# Patient Record
Sex: Female | Born: 1990 | Hispanic: Yes | Marital: Single | Smoking: Never smoker
Health system: Southern US, Community
[De-identification: ages and names within clinical notes are randomized; demographics above are authoritative.]

## PROBLEM LIST (undated history)

## (undated) DIAGNOSIS — Z789 Other specified health status: Secondary | ICD-10-CM

## (undated) HISTORY — DX: Other specified health status: Z78.9

## (undated) HISTORY — PX: NO PAST SURGERIES: SHX2092

---

## 2012-04-25 ENCOUNTER — Emergency Department (HOSPITAL_COMMUNITY)
Admission: EM | Admit: 2012-04-25 | Discharge: 2012-04-25 | Discharge status: Against Medical Advice | Payer: Medicaid Other | Attending: Emergency Medicine | Admitting: Emergency Medicine

## 2012-04-25 ENCOUNTER — Encounter (HOSPITAL_COMMUNITY): Payer: Self-pay | Admitting: *Deleted

## 2012-04-25 DIAGNOSIS — IMO0002 Reserved for concepts with insufficient information to code with codable children: Secondary | ICD-10-CM | POA: Insufficient documentation

## 2012-04-25 DIAGNOSIS — S0083XA Contusion of other part of head, initial encounter: Secondary | ICD-10-CM

## 2012-04-25 DIAGNOSIS — Z3201 Encounter for pregnancy test, result positive: Secondary | ICD-10-CM | POA: Insufficient documentation

## 2012-04-25 DIAGNOSIS — Z3490 Encounter for supervision of normal pregnancy, unspecified, unspecified trimester: Secondary | ICD-10-CM

## 2012-04-25 LAB — URINALYSIS, ROUTINE W REFLEX MICROSCOPIC
Bilirubin Urine: NEGATIVE
Nitrite: NEGATIVE
Specific Gravity, Urine: 1.019 (ref 1.005–1.030)
Urobilinogen, UA: 0.2 mg/dL (ref 0.0–1.0)
pH: 6 (ref 5.0–8.0)

## 2012-04-25 LAB — URINE MICROSCOPIC-ADD ON

## 2012-04-25 LAB — PREGNANCY, URINE: Preg Test, Ur: POSITIVE — AB

## 2012-04-25 MED ORDER — TETANUS-DIPHTH-ACELL PERTUSSIS 5-2.5-18.5 LF-MCG/0.5 IM SUSP
0.5000 mL | Freq: Once | INTRAMUSCULAR | Status: DC
  Filled 2012-04-25: qty 0.5
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
## 2012-04-25 NOTE — ED Notes (Signed)
Pt not located
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
## 2012-08-20 LAB — OB RESULTS CONSOLE ABO/RH

## 2012-08-20 LAB — OB RESULTS CONSOLE HIV ANTIBODY (ROUTINE TESTING): HIV: NONREACTIVE

## 2012-08-20 LAB — OB RESULTS CONSOLE ANTIBODY SCREEN: Antibody Screen: NEGATIVE

## 2012-10-23 NOTE — L&D Delivery Note (Signed)
Delivery Note At 11:43 PM a viable female was delivered via Vaginal, Spontaneous Delivery (Presentation: Vertex ;  ).  APGAR: 9 - 10, ; weight . 3285 grams   Placenta status: intact , .  Cord: 3 vessel  with the following complications: None .  Cord pH: none  Anesthesia:  None Episiotomy:  None Lacerations: None Suture Repair: None Est. Blood Loss (mL): 350  Mom to postpartum.  Baby to nursery-stable.  Ruthetta Koopmann A 11/23/2012, 12:19 AM

## 2012-10-31 LAB — OB RESULTS CONSOLE GC/CHLAMYDIA: Gonorrhea: NEGATIVE

## 2012-10-31 LAB — OB RESULTS CONSOLE GBS: GBS: NEGATIVE

## 2012-11-21 ENCOUNTER — Encounter (HOSPITAL_COMMUNITY): Payer: Self-pay | Admitting: Obstetrics and Gynecology

## 2012-11-21 ENCOUNTER — Inpatient Hospital Stay (HOSPITAL_COMMUNITY): Payer: Medicaid Other

## 2012-11-21 ENCOUNTER — Inpatient Hospital Stay (HOSPITAL_COMMUNITY)
Admission: AD | Admit: 2012-11-21 | Discharge: 2012-11-21 | Disposition: A | Discharge status: Home / Self Care | Payer: Medicaid Other | Source: Ambulatory Visit | Attending: Obstetrics | Admitting: Obstetrics

## 2012-11-21 DIAGNOSIS — Z3689 Encounter for other specified antenatal screening: Secondary | ICD-10-CM

## 2012-11-21 DIAGNOSIS — O479 False labor, unspecified: Secondary | ICD-10-CM | POA: Insufficient documentation

## 2012-11-21 DIAGNOSIS — O368190 Decreased fetal movements, unspecified trimester, not applicable or unspecified: Secondary | ICD-10-CM | POA: Insufficient documentation

## 2012-11-21 HISTORY — DX: Other specified health status: Z78.9

## 2012-11-21 NOTE — MAU Note (Signed)
"  Decreased fetal movement since yesterday.  I was sent here today from the doctor's office.  The CNM sent me over here, because the baby wasn't moving that much in the office."

## 2012-11-21 NOTE — MAU Provider Note (Signed)
Chief Complaint:  No chief complaint on file.   First Provider Initiated Contact with Patient 11/21/12 1752      HPI: Haley Velasquez is a 22 y.o. [id number] at [redacted]w[redacted]d who presents to maternity admissions reporting decreased fetal movement. The patient states that the last time she felt normal movement was Tuesday. She was seen in the office by Dr. Gaynell Face today and told that she was 3 cm dilated. She denies vaginal bleeding, LOF, abnormal discharge, abdominal pain or contractions.   Past Medical History: Past Medical History  Diagnosis Date  . No pertinent past medical history     Past obstetric history: OB History    Grav Para Term Preterm Abortions TAB SAB Ect Mult Living   6 5 5       5      # Outc Date GA Lbr Len/2nd Wgt Sex Del Anes PTL Lv   1 TRM            2 TRM            3 TRM            4 TRM            5 TRM            6 CUR               Past Surgical History: Past Surgical History  Procedure Date  . No past surgeries     Family History: History reviewed. No pertinent family history.  Social History: History  Substance Use Topics  . Smoking status: Never Smoker   . Smokeless tobacco: Not on file  . Alcohol Use: No    Allergies: No Known Allergies  Meds:  No prescriptions prior to admission    ROS: Pertinent findings in history of present illness.  Physical Exam  Blood pressure 120/53, pulse 61, temperature 97.3 F (36.3 C), temperature source Oral, resp. rate 18, SpO2 100.00%. GENERAL: Well-developed, well-nourished female in no acute distress.  HEENT: normocephalic HEART: normal rate and rhythm RESP: normal effort, clear to auscultation ABDOMEN: Soft, non-tender, gravid appropriate for gestational age EXTREMITIES: Nontender, no edema NEURO: alert and oriented  Dilation: 3 Effacement (%): 60 Cervical Position: Posterior Station: -3 Exam by:: Naaman Plummer PA  First 30 minutes of monitoring: FHT:  Baseline 120, minimal variability, one  acceleration present, no decelerations Contractions: q 8 mins  Next 20 minutes FHT: Baseline 125, moderate variability, accelerations present, no decelerations Contractions: q 8 minutes   Imaging:  *RADIOLOGY REPORT*   Clinical Data: 22 year old pregnant female with decreased fetal  movement. Assigned gestational age of [redacted] weeks 4 days.   LIMITED OBSTETRIC ULTRASOUND  BIOPHYSICAL PROFILE   Number of Fetuses: 1  Heart Rate: 126bpm  Movement: Present  Presentation: Cephalic   MATERNAL FINDINGS:  Cervix: Not evaluated  Uterus/Adnexae: Not evaluated  BPP:  Movement: 2 Time: 5 minutes  Breathing: 2  Tone: 2  Amniotic Fluid: 2  Total Score: 8/8   Impression:  Single living intrauterine gestation in cephalic presentation -  assigned gestational age of [redacted] weeks 4 days.  BPP - 8/8.   This exam is performed on an emergent basis and does not  comprehensively evaluate fetal size, dating, or anatomy.   Original Report Authenticated By: Harmon Pier, M.D.  MAU Course: Discussed patient with Dr. Clearance Coots. He recommends BPP today because of non-reactive NST NST reactive after BPP. BPP 8/8. Cervix unchanged from Dr. Gaynell Face exam earlier today. Patient  does not feel her contractions.   Assessment: 1. NST (non-stress test) reactive     Plan: Discharge home Labor precautions and fetal kick counts discussed Patient encouraged to increase PO intake and maintain adequate hydration Patient will call Dr. Elsie Stain office tomorrow to find out about follow-up. Patient states that she does not have a follow-up appointment scheduled at this time.  Patient may return to MAU as needed      Follow-up Information    Follow up with MARSHALL,BERNARD A, MD. (as scheduled)    Contact information:   [address] [place] [address] [phone]           Medication List     As of 11/21/2012  7:59 PM    TAKE these medications         acetaminophen-codeine 300-30  MG per tablet   Commonly known as: TYLENOL #3   Take 1 tablet by mouth every 4 (four) hours as needed. headache      guaiFENesin 100 MG/5ML Soln   Commonly known as: ROBITUSSIN   Take 5 mLs by mouth every 4 (four) hours as needed.         Freddi Starr, PA-C 11/21/2012 7:59 PM

## 2012-11-22 ENCOUNTER — Inpatient Hospital Stay (HOSPITAL_COMMUNITY)
Admission: AD | Admit: 2012-11-22 | Discharge: 2012-11-24 | DRG: 775 | Disposition: A | Discharge status: Home / Self Care | Payer: Medicaid Other | Source: Ambulatory Visit | Attending: Obstetrics | Admitting: Obstetrics

## 2012-11-22 ENCOUNTER — Encounter (HOSPITAL_COMMUNITY): Payer: Self-pay | Admitting: *Deleted

## 2012-11-22 DIAGNOSIS — O480 Post-term pregnancy: Principal | ICD-10-CM | POA: Diagnosis present

## 2012-11-22 DIAGNOSIS — O368190 Decreased fetal movements, unspecified trimester, not applicable or unspecified: Secondary | ICD-10-CM | POA: Diagnosis present

## 2012-11-22 LAB — CBC
Hemoglobin: 10.9 g/dL — ABNORMAL LOW (ref 12.0–15.0)
MCHC: 32.8 g/dL (ref 30.0–36.0)
Platelets: 399 10*3/uL (ref 150–400)
RDW: 14.8 % (ref 11.5–15.5)

## 2012-11-22 MED ORDER — ACETAMINOPHEN 325 MG PO TABS: 650.0000 mg | ORAL_TABLET | ORAL | Status: DC | PRN

## 2012-11-22 MED ORDER — OXYTOCIN BOLUS FROM INFUSION
500.0000 mL | INTRAVENOUS | Status: DC
  Administered 2012-11-22: 500 mL via INTRAVENOUS

## 2012-11-22 MED ORDER — IBUPROFEN 600 MG PO TABS
600.0000 mg | ORAL_TABLET | Freq: Four times a day (QID) | ORAL | Status: DC | PRN
  Administered 2012-11-23: 600 mg via ORAL
  Filled 2012-11-22: qty 1

## 2012-11-22 MED ORDER — ONDANSETRON HCL 4 MG/2ML IJ SOLN: 4.0000 mg | Freq: Four times a day (QID) | INTRAMUSCULAR | Status: DC | PRN

## 2012-11-22 MED ORDER — PROMETHAZINE HCL 25 MG/ML IJ SOLN
25.0000 mg | Freq: Four times a day (QID) | INTRAMUSCULAR | Status: DC | PRN
  Administered 2012-11-22: 25 mg via INTRAMUSCULAR
  Filled 2012-11-22: qty 1

## 2012-11-22 MED ORDER — LACTATED RINGERS IV SOLN: 500.0000 mL | INTRAVENOUS | Status: DC | PRN

## 2012-11-22 MED ORDER — OXYCODONE-ACETAMINOPHEN 5-325 MG PO TABS: 1.0000 | ORAL_TABLET | ORAL | Status: DC | PRN

## 2012-11-22 MED ORDER — LACTATED RINGERS IV SOLN
INTRAVENOUS | Status: DC
  Administered 2012-11-22: 19:00:00 via INTRAVENOUS

## 2012-11-22 MED ORDER — CITRIC ACID-SODIUM CITRATE 334-500 MG/5ML PO SOLN: 30.0000 mL | ORAL | Status: DC | PRN

## 2012-11-22 MED ORDER — NALBUPHINE SYRINGE 5 MG/0.5 ML
10.0000 mg | INJECTION | INTRAMUSCULAR | Status: DC | PRN
  Administered 2012-11-22 (×2): 10 mg via INTRAVENOUS
  Filled 2012-11-22 (×3): qty 1

## 2012-11-22 MED ORDER — LIDOCAINE HCL (PF) 1 % IJ SOLN
30.0000 mL | INTRAMUSCULAR | Status: DC | PRN
Start: 2012-11-22 — End: 2012-11-23
  Filled 2012-11-22 (×2): qty 30

## 2012-11-22 MED ORDER — OXYTOCIN 40 UNITS IN LACTATED RINGERS INFUSION - SIMPLE MED
62.5000 mL/h | INTRAVENOUS | Status: DC
  Filled 2012-11-22: qty 1000

## 2012-11-22 MED ORDER — NALBUPHINE SYRINGE 5 MG/0.5 ML
10.0000 mg | INJECTION | Freq: Four times a day (QID) | INTRAMUSCULAR | Status: DC | PRN
  Administered 2012-11-22: 10 mg via INTRAMUSCULAR
  Filled 2012-11-22 (×2): qty 1

## 2012-11-22 MED ORDER — TERBUTALINE SULFATE 1 MG/ML IJ SOLN: 0.2500 mg | Freq: Once | INTRAMUSCULAR | Status: AC | PRN

## 2012-11-22 MED ORDER — OXYTOCIN 40 UNITS IN LACTATED RINGERS INFUSION - SIMPLE MED
1.0000 m[IU]/min | INTRAVENOUS | Status: DC
  Administered 2012-11-22: 1 m[IU]/min via INTRAVENOUS

## 2012-11-22 NOTE — Progress Notes (Signed)
Haley Velasquez is a 22 y.o. [id number] at [redacted]w[redacted]d by LMP admitted for decreased FM and UC's with favorable cervix, postdates.  Subjective:   Objective: BP 101/54  Pulse 62  Temp 97.8 F (36.6 C) (Oral)  Resp 18  Ht 4\' 10"  (1.473 m)  Wt 152 lb (68.947 kg)  BMI 31.77 kg/m2  Breastfeeding? Unknown      FHT:  FHR: 150 bpm, variability: moderate,  accelerations:  Present,  decelerations:  Absent UC:   regular, every 3-4 minutes SVE:   Dilation: 4.5 Effacement (%): 80 Station: -2 Exam by:: Lilli Few, RN  Labs: Lab Results  Component Value Date   WBC 12.8* 11/22/2012   HGB 10.9* 11/22/2012   HCT 33.2* 11/22/2012   MCV 83.6 11/22/2012   PLT 399 11/22/2012    Assessment / Plan: Augmentation of labor, progressing well  Labor: Progressing normally Preeclampsia:  n/a Fetal Wellbeing:  Category I Pain Control:  Nubain I/D:  n/a Anticipated MOD:  NSVD  HARPER,CHARLES A 11/22/2012, 10:33 PM

## 2012-11-22 NOTE — H&P (Signed)
Haley Velasquez is Velasquez 22 y.o. female presenting for UC's and decreased FM. Maternal Medical History:  Reason for admission: Reason for admission: contractions.  21 y o G 7 P 5  EDC 11-17-12.  Presents with UC's.  Contractions: Onset was 6-12 hours ago.    Fetal activity: Perceived fetal activity is decreased.   Last perceived fetal movement was greater than 24 hours ago.    Prenatal complications: no prenatal complications Prenatal Complications - Diabetes: none.    OB History    Grav Para Term Preterm Abortions TAB SAB Ect Mult Living   7 5 5  1  1   5      Past Medical History  Diagnosis Date  . No pertinent past medical history    Past Surgical History  Procedure Date  . No past surgeries    Family History: family history is not on file. Social History:  reports that she has never smoked. She has never used smokeless tobacco. She reports that she does not drink alcohol or use illicit drugs.   Prenatal Transfer Tool  Maternal Diabetes: No Genetic Screening: Declined Maternal Ultrasounds/Referrals: Normal Fetal Ultrasounds or other Referrals:  None Maternal Substance Abuse:  No Significant Maternal Medications:  None Significant Maternal Lab Results:  None Other Comments:  None  Review of Systems  All other systems reviewed and are negative.    Dilation: 4.5 Effacement (%): 80 Station: -2 Exam by:: C. Blackstock, RN Blood pressure 101/54, pulse 62, temperature 97.8 F (36.6 C), temperature source Oral, resp. rate 18, height 4\' 10"  (1.473 m), weight 152 lb (68.947 kg), unknown if currently breastfeeding. Maternal Exam:  Uterine Assessment: Contraction strength is mild.  Contraction frequency is regular.   Abdomen: Patient reports no abdominal tenderness. Fetal presentation: vertex  Introitus: Normal vulva. Normal vagina.  Pelvis: adequate for delivery.   Cervix: Cervix evaluated by digital exam.     Physical Exam  Nursing note and vitals  reviewed. Constitutional: She is oriented to person, place, and time. She appears well-developed and well-nourished.  HENT:  Head: Normocephalic and atraumatic.  Eyes: Conjunctivae normal are normal. Pupils are equal, round, and reactive to light.  Neck: Normal range of motion. Neck supple.  Cardiovascular: Normal rate and regular rhythm.   Respiratory: Effort normal.  GI: Soft.  Genitourinary: Vagina normal and uterus normal.  Musculoskeletal: Normal range of motion.  Neurological: She is alert and oriented to person, place, and time.  Skin: Skin is warm and dry.  Psychiatric: She has Velasquez normal mood and affect. Her behavior is normal. Judgment and thought content normal.    Prenatal labs: ABO, Rh: --/--/O POS (01/31 2044) Antibody: NEG (01/31 2044) Rubella: Immune (10/29 0000) RPR: Nonreactive (10/29 0000)  HBsAg: Negative (10/29 0000)  HIV: Non-reactive (10/29 0000)  GBS: Negative (01/09 0000)   Assessment/Plan: 40.5 weeks.  Decreased FM.  Admit.  Augmentation of labor.   Haley Velasquez 11/22/2012, 10:27 PM

## 2012-11-22 NOTE — MAU Note (Signed)
Pt states that her physician told her to come to the ER because she was having pain this am.

## 2012-11-22 NOTE — Progress Notes (Signed)
Dr Clearance Coots notified of pt's arrival and complaints of contractions since this am. Orders received

## 2012-11-23 ENCOUNTER — Encounter (HOSPITAL_COMMUNITY): Payer: Self-pay | Admitting: *Deleted

## 2012-11-23 LAB — CBC
HCT: 29.6 % — ABNORMAL LOW (ref 36.0–46.0)
Hemoglobin: 9.6 g/dL — ABNORMAL LOW (ref 12.0–15.0)
MCH: 27.4 pg (ref 26.0–34.0)
RBC: 3.5 MIL/uL — ABNORMAL LOW (ref 3.87–5.11)

## 2012-11-23 LAB — RPR: RPR Ser Ql: NONREACTIVE

## 2012-11-23 MED ORDER — LANOLIN HYDROUS EX OINT: TOPICAL_OINTMENT | CUTANEOUS | Status: DC | PRN

## 2012-11-23 MED ORDER — ONDANSETRON HCL 4 MG PO TABS: 4.0000 mg | ORAL_TABLET | ORAL | Status: DC | PRN

## 2012-11-23 MED ORDER — DIPHENHYDRAMINE HCL 25 MG PO CAPS: 25.0000 mg | ORAL_CAPSULE | Freq: Four times a day (QID) | ORAL | Status: DC | PRN

## 2012-11-23 MED ORDER — OXYCODONE-ACETAMINOPHEN 5-325 MG PO TABS: 1.0000 | ORAL_TABLET | ORAL | Status: DC | PRN

## 2012-11-23 MED ORDER — INFLUENZA VIRUS VACC SPLIT PF IM SUSP
0.5000 mL | INTRAMUSCULAR | Status: AC
  Administered 2012-11-24: 0.5 mL via INTRAMUSCULAR

## 2012-11-23 MED ORDER — MEDROXYPROGESTERONE ACETATE 150 MG/ML IM SUSP: 150.0000 mg | INTRAMUSCULAR | Status: DC | PRN

## 2012-11-23 MED ORDER — SIMETHICONE 80 MG PO CHEW: 80.0000 mg | CHEWABLE_TABLET | ORAL | Status: DC | PRN

## 2012-11-23 MED ORDER — DIBUCAINE 1 % RE OINT: 1.0000 "application " | TOPICAL_OINTMENT | RECTAL | Status: DC | PRN

## 2012-11-23 MED ORDER — TETANUS-DIPHTH-ACELL PERTUSSIS 5-2.5-18.5 LF-MCG/0.5 IM SUSP
0.5000 mL | Freq: Once | INTRAMUSCULAR | Status: AC
  Administered 2012-11-23: 0.5 mL via INTRAMUSCULAR

## 2012-11-23 MED ORDER — IBUPROFEN 600 MG PO TABS
600.0000 mg | ORAL_TABLET | Freq: Four times a day (QID) | ORAL | Status: DC
  Administered 2012-11-23 – 2012-11-24 (×5): 600 mg via ORAL
  Filled 2012-11-23 (×5): qty 1

## 2012-11-23 MED ORDER — OXYTOCIN 40 UNITS IN LACTATED RINGERS INFUSION - SIMPLE MED: 62.5000 mL/h | INTRAVENOUS | Status: DC | PRN

## 2012-11-23 MED ORDER — BENZOCAINE-MENTHOL 20-0.5 % EX AERO
1.0000 "application " | INHALATION_SPRAY | CUTANEOUS | Status: DC | PRN
  Administered 2012-11-23: 1 via TOPICAL
  Filled 2012-11-23: qty 56

## 2012-11-23 MED ORDER — PRENATAL MULTIVITAMIN CH
1.0000 | ORAL_TABLET | Freq: Every day | ORAL | Status: DC
  Administered 2012-11-23 – 2012-11-24 (×2): 1 via ORAL
  Filled 2012-11-23 (×2): qty 1

## 2012-11-23 MED ORDER — ONDANSETRON HCL 4 MG/2ML IJ SOLN: 4.0000 mg | INTRAMUSCULAR | Status: DC | PRN

## 2012-11-23 MED ORDER — SENNOSIDES-DOCUSATE SODIUM 8.6-50 MG PO TABS: 2.0000 | ORAL_TABLET | Freq: Every day | ORAL | Status: DC

## 2012-11-23 MED ORDER — ZOLPIDEM TARTRATE 5 MG PO TABS: 5.0000 mg | ORAL_TABLET | Freq: Every evening | ORAL | Status: DC | PRN

## 2012-11-23 MED ORDER — WITCH HAZEL-GLYCERIN EX PADS: 1.0000 "application " | MEDICATED_PAD | CUTANEOUS | Status: DC | PRN

## 2012-11-23 NOTE — Progress Notes (Signed)
Post Partum Day 1 Subjective: no complaints  Objective: Blood pressure 91/51, pulse 69, temperature 98.4 F (36.9 C), temperature source Oral, resp. rate 18, height 4\' 10"  (1.473 m), weight 152 lb (68.947 kg), unknown if currently breastfeeding.  Physical Exam:  General: alert and no distress Lochia: appropriate Uterine Fundus: firm Incision: none DVT Evaluation: No evidence of DVT seen on physical exam.   Basename 11/23/12 0615 11/22/12 1848  HGB 9.6* 10.9*  HCT 29.6* 33.2*    Assessment/Plan: Plan for discharge tomorrow   LOS: 1 day   Haley Velasquez A 11/23/2012, 6:39 AM

## 2012-11-24 MED ORDER — IBUPROFEN 600 MG PO TABS: 600.0000 mg | ORAL_TABLET | Freq: Four times a day (QID) | ORAL | Status: DC | PRN

## 2012-11-24 MED ORDER — OXYCODONE-ACETAMINOPHEN 5-325 MG PO TABS: 1.0000 | ORAL_TABLET | ORAL | Status: DC | PRN

## 2012-11-24 MED ORDER — FERROUS SULFATE 300 (60 FE) MG/5ML PO SYRP: 300.0000 mg | ORAL_SOLUTION | Freq: Every day | ORAL | Status: DC

## 2012-11-24 NOTE — Progress Notes (Signed)
Post Partum Day 1.5 Subjective: no complaints  Objective: Blood pressure 97/60, pulse 62, temperature 97.5 F (36.4 C), temperature source Oral, resp. rate 18, height 4\' 10"  (1.473 m), weight 152 lb (68.947 kg), unknown if currently breastfeeding.  Physical Exam:  General: alert and no distress Lochia: appropriate Uterine Fundus: firm Incision: none DVT Evaluation: No evidence of DVT seen on physical exam.   Basename 11/23/12 0615 11/22/12 1848  HGB 9.6* 10.9*  HCT 29.6* 33.2*    Assessment/Plan: Discharge home   LOS: 2 days   HARPER,CHARLES A 11/24/2012, 6:47 AM

## 2012-11-24 NOTE — Discharge Summary (Signed)
Obstetric Discharge Summary Reason for Admission: onset of labor Prenatal Procedures: ultrasound Intrapartum Procedures: spontaneous vaginal delivery Postpartum Procedures: none Complications-Operative and Postpartum: none Hemoglobin  Date Value Range Status  11/23/2012 9.6* 12.0 - 15.0 g/dL Final     HCT  Date Value Range Status  11/23/2012 29.6* 36.0 - 46.0 % Final    Physical Exam:  General: alert and no distress Lochia: appropriate Uterine Fundus: firm Incision: none DVT Evaluation: No evidence of DVT seen on physical exam.  Discharge Diagnoses: Term Pregnancy-delivered  Discharge Information: Date: 11/24/2012 Activity: pelvic rest Diet: routine Medications: PNV, Ibuprofen, Colace, Iron and Percocet Condition: stable Instructions: refer to practice specific booklet Discharge to: home Follow-up Information    Follow up with MARSHALL,BERNARD A, MD. Schedule an appointment as soon as possible for a visit in 6 weeks.   Contact information:   [address] [place] [address] [phone]          Newborn Data: Live born female  Birth Weight: 7 lb 3.9 oz (3285 g) APGAR: 9, 10  Home with mother.  Avalon Coppinger A 11/24/2012, 7:01 AM

## 2012-11-25 NOTE — Progress Notes (Signed)
Post discharge chart review completed.  

## 2012-11-26 LAB — TYPE AND SCREEN
ABO/RH(D): O POS
Antibody Screen: NEGATIVE
Unit division: 0

## 2013-09-14 ENCOUNTER — Emergency Department (HOSPITAL_COMMUNITY)
Admission: EM | Admit: 2013-09-14 | Discharge: 2013-09-14 | Disposition: A | Discharge status: Home / Self Care | Payer: Medicaid Other | Attending: Emergency Medicine | Admitting: Emergency Medicine

## 2013-09-14 ENCOUNTER — Encounter (HOSPITAL_COMMUNITY): Payer: Self-pay | Admitting: Emergency Medicine

## 2013-09-14 DIAGNOSIS — E876 Hypokalemia: Secondary | ICD-10-CM

## 2013-09-14 DIAGNOSIS — J039 Acute tonsillitis, unspecified: Secondary | ICD-10-CM

## 2013-09-14 DIAGNOSIS — R509 Fever, unspecified: Secondary | ICD-10-CM | POA: Insufficient documentation

## 2013-09-14 DIAGNOSIS — R112 Nausea with vomiting, unspecified: Secondary | ICD-10-CM | POA: Insufficient documentation

## 2013-09-14 DIAGNOSIS — E860 Dehydration: Secondary | ICD-10-CM

## 2013-09-14 LAB — POCT I-STAT 3, ART BLOOD GAS (G3+)
Acid-base deficit: 2 mmol/L (ref 0.0–2.0)
O2 Saturation: 96 %
Patient temperature: 98.6

## 2013-09-14 LAB — CBC
MCH: 28.9 pg (ref 26.0–34.0)
MCHC: 35.3 g/dL (ref 30.0–36.0)
MCV: 81.9 fL (ref 78.0–100.0)
Platelets: 297 10*3/uL (ref 150–400)
RDW: 14.1 % (ref 11.5–15.5)
WBC: 11.2 10*3/uL — ABNORMAL HIGH (ref 4.0–10.5)

## 2013-09-14 LAB — POCT I-STAT, CHEM 8
Calcium, Ion: 1.14 mmol/L (ref 1.12–1.23)
Creatinine, Ser: 0.6 mg/dL (ref 0.50–1.10)
Glucose, Bld: 95 mg/dL (ref 70–99)
HCT: 39 % (ref 36.0–46.0)
Hemoglobin: 13.3 g/dL (ref 12.0–15.0)
Potassium: 3.1 mEq/L — ABNORMAL LOW (ref 3.5–5.1)
TCO2: 24 mmol/L (ref 0–100)

## 2013-09-14 LAB — RAPID STREP SCREEN (MED CTR MEBANE ONLY): Streptococcus, Group A Screen (Direct): NEGATIVE

## 2013-09-14 MED ORDER — DEXAMETHASONE SODIUM PHOSPHATE 10 MG/ML IJ SOLN
10.0000 mg | Freq: Once | INTRAMUSCULAR | Status: AC
  Administered 2013-09-14: 10 mg via INTRAVENOUS
  Filled 2013-09-14: qty 1

## 2013-09-14 MED ORDER — SODIUM CHLORIDE 0.9 % IV BOLUS (SEPSIS)
1000.0000 mL | Freq: Once | INTRAVENOUS | Status: AC
  Administered 2013-09-14: 1000 mL via INTRAVENOUS

## 2013-09-14 MED ORDER — FENTANYL CITRATE 0.05 MG/ML IJ SOLN
100.0000 ug | Freq: Once | INTRAMUSCULAR | Status: AC
  Administered 2013-09-14: 100 ug via INTRAVENOUS
  Filled 2013-09-14: qty 2

## 2013-09-14 MED ORDER — POTASSIUM CHLORIDE 20 MEQ/15ML (10%) PO LIQD
40.0000 meq | Freq: Once | ORAL | Status: AC
  Administered 2013-09-14: 40 meq via ORAL
  Filled 2013-09-14: qty 30

## 2013-09-14 MED ORDER — HYDROCODONE-ACETAMINOPHEN 7.5-325 MG/15ML PO SOLN: 15.0000 mL | ORAL | Status: DC | PRN

## 2013-09-14 MED ORDER — HYDROCODONE-ACETAMINOPHEN 7.5-325 MG/15ML PO SOLN
15.0000 mL | Freq: Once | ORAL | Status: AC
  Administered 2013-09-14: 15 mL via ORAL
  Filled 2013-09-14: qty 15

## 2013-09-14 NOTE — ED Notes (Signed)
Md at bedside

## 2013-09-14 NOTE — ED Notes (Addendum)
Pt O2 sats dropped to mid 70% on RA with good wave form; O2 prob changed out and same reading given. Pt able to speak and control secretions, no distress noted. Pt placed on 4 L/m via n/c. Md notified. Verbal order to d/c O2 for 20 mins will have Blood Gas drawn on RA. Pt dropped down to low 80s for about 20 secs after O2 removed but came back up to mid 90s shortly after.

## 2013-09-14 NOTE — ED Notes (Signed)
Pt c/o sore throat since Wednesday painful to swallow and talk, with nausea.

## 2013-09-14 NOTE — ED Provider Notes (Signed)
Patient signed out to me to followup on blood gas. Patient seen primarily for tonsillitis. She had evidence of dehydration and viral pharyngitis/tonsillitis. Strep is negative. Vision was administered IV fluids and potassium for her mild hypokalemia. Blood gas was performed because the nurse stated that the patient had a period of low oxygen saturations. I suspect that this was spurious, patient has had multiple documented O2 sats of 95-99%. Blood gas was normal. I did reevaluate the patient. She is resting comfortably, oxygen saturation 99% on room air at time of my evaluation. Oropharyngeal examination does reveal bilateral tonsillar enlargement with exudate, no sign of abscess. Patient given Decadron for swelling of the tonsils, will be discharged with analgesia. Followup with ENT, return to ER if symptoms worsen.  Gilda Crease, MD 09/14/13 [phone]

## 2013-09-14 NOTE — ED Provider Notes (Signed)
CSN: [id number]     Arrival date & time 09/14/13  0043 History   First MD Initiated Contact with Patient 09/14/13 0511    Is obtained from medical interpreter using language line. 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 Sore Throat   (Consider location/radiation/quality/duration/timing/severity/associated sxs/prior Treatment) HPI Complains of sore throat and pain on swallowing onset 4 days ago. Symptoms accompanied by subjective fever. She is treated herself with Advil, without relief. She states that she vomits whenever she tries to eat or drink anything. Denies nausea at present. Pain worse with swallowing not improved by anything. Moderate to severe present. Past Medical History  Diagnosis Date  . No pertinent past medical history    as that which is negative Past Surgical History  Procedure Laterality Date  . No past surgeries     No family history on file. History  Substance Use Topics  . Smoking status: Never Smoker   . Smokeless tobacco: Never Used  . Alcohol Use: No   OB History   Grav Para Term Preterm Abortions TAB SAB Ect Mult Living   7 6 6  1  1   6      Review of Systems  Constitutional: Negative.   HENT: Positive for sore throat.   Respiratory: Negative.   Cardiovascular: Negative.   Gastrointestinal: Positive for nausea and vomiting.  Musculoskeletal: Negative.   Skin: Negative.   Neurological: Negative.   Psychiatric/Behavioral: Negative.   All other systems reviewed and are negative.    Allergies  Review of patient's allergies indicates no known allergies.  Home Medications   Current Outpatient Rx  Name  Route  Sig  Dispense  Refill  . ibuprofen (ADVIL,MOTRIN) 600 MG tablet   Oral   Take 1 tablet (600 mg total) by mouth every 6 (six) hours as needed for pain.   30 tablet   5    BP 97/53  Pulse 104  Temp(Src) 99 F (37.2 C) (Oral)  Resp 18  Ht 4\' 11"  (1.499 m)  Wt 135 lb 3.2 oz (61.326 kg)  BMI 27.29 kg/m2   SpO2 100%  LMP 08/28/2013 Physical Exam  Nursing note and vitals reviewed. Constitutional: She appears well-developed and well-nourished.  HENT:  Head: Normocephalic and atraumatic.  Right Ear: External ear normal.  Left Ear: External ear normal.  Mouth/Throat: Oropharyngeal exudate present.  Bilateral tympanic membranes normal. Uvula midline tonsils large red with white exudate bilaterally  Eyes: Conjunctivae are normal. Pupils are equal, round, and reactive to light.  Neck: Neck supple. No tracheal deviation present. No thyromegaly present.  Cardiovascular: Normal rate and regular rhythm.   No murmur heard. Pulmonary/Chest: Effort normal and breath sounds normal.  Abdominal: Soft. Bowel sounds are normal. She exhibits no distension. There is no tenderness.  Musculoskeletal: Normal range of motion. She exhibits no edema and no tenderness.  Lymphadenopathy:    She has cervical adenopathy.  Neurological: She is alert. Coordination normal.  Skin: Skin is warm and dry. No rash noted.  Psychiatric: She has a normal mood and affect.    ED Course  Procedures (including critical care time) Labs Review Labs Reviewed  RAPID STREP SCREEN  CULTURE, GROUP A STREP   Imaging Review No results found.  EKG Interpretation   None     pt signed out to Dr Blinda Leatherwood at 805am  pt able to drink in the ed MDM  No diagnosis found. Patient felt to be dehydrated secondary to poor oral intake Dx #  1 pharyngitis #2 hypokalemia #3 dehydration    Doug Sou, MD 09/14/13 [phone]

## 2013-09-14 NOTE — ED Notes (Signed)
Pt states she feels she like she has a ball in her throat that does not let her swallow. Pt states she started vomiting yesterday.

## 2013-09-16 LAB — CULTURE, GROUP A STREP

## 2014-03-25 ENCOUNTER — Encounter (HOSPITAL_COMMUNITY): Payer: Self-pay | Admitting: Emergency Medicine

## 2014-03-25 ENCOUNTER — Emergency Department (HOSPITAL_COMMUNITY)
Admission: EM | Admit: 2014-03-25 | Discharge: 2014-03-25 | Disposition: A | Discharge status: Home / Self Care | Payer: No Typology Code available for payment source | Attending: Emergency Medicine | Admitting: Emergency Medicine

## 2014-03-25 DIAGNOSIS — Z792 Long term (current) use of antibiotics: Secondary | ICD-10-CM | POA: Insufficient documentation

## 2014-03-25 DIAGNOSIS — IMO0002 Reserved for concepts with insufficient information to code with codable children: Secondary | ICD-10-CM | POA: Insufficient documentation

## 2014-03-25 DIAGNOSIS — B9689 Other specified bacterial agents as the cause of diseases classified elsewhere: Secondary | ICD-10-CM | POA: Insufficient documentation

## 2014-03-25 DIAGNOSIS — Z3202 Encounter for pregnancy test, result negative: Secondary | ICD-10-CM | POA: Insufficient documentation

## 2014-03-25 DIAGNOSIS — Z113 Encounter for screening for infections with a predominantly sexual mode of transmission: Secondary | ICD-10-CM | POA: Insufficient documentation

## 2014-03-25 DIAGNOSIS — N390 Urinary tract infection, site not specified: Secondary | ICD-10-CM | POA: Insufficient documentation

## 2014-03-25 DIAGNOSIS — Z711 Person with feared health complaint in whom no diagnosis is made: Secondary | ICD-10-CM

## 2014-03-25 DIAGNOSIS — A499 Bacterial infection, unspecified: Secondary | ICD-10-CM | POA: Insufficient documentation

## 2014-03-25 DIAGNOSIS — N760 Acute vaginitis: Secondary | ICD-10-CM | POA: Insufficient documentation

## 2014-03-25 DIAGNOSIS — Z791 Long term (current) use of non-steroidal anti-inflammatories (NSAID): Secondary | ICD-10-CM | POA: Insufficient documentation

## 2014-03-25 LAB — URINALYSIS, ROUTINE W REFLEX MICROSCOPIC
Bilirubin Urine: NEGATIVE
GLUCOSE, UA: NEGATIVE mg/dL
Hgb urine dipstick: NEGATIVE
Ketones, ur: NEGATIVE mg/dL
Nitrite: POSITIVE — AB
PH: 6.5 (ref 5.0–8.0)
PROTEIN: NEGATIVE mg/dL
SPECIFIC GRAVITY, URINE: 1.024 (ref 1.005–1.030)
Urobilinogen, UA: 0.2 mg/dL (ref 0.0–1.0)

## 2014-03-25 LAB — WET PREP, GENITAL
Trich, Wet Prep: NONE SEEN
YEAST WET PREP: NONE SEEN

## 2014-03-25 LAB — URINE MICROSCOPIC-ADD ON

## 2014-03-25 LAB — PREGNANCY, URINE: PREG TEST UR: NEGATIVE

## 2014-03-25 MED ORDER — CEFTRIAXONE SODIUM 250 MG IJ SOLR
250.0000 mg | Freq: Once | INTRAMUSCULAR | Status: AC
  Administered 2014-03-25: 250 mg via INTRAMUSCULAR
  Filled 2014-03-25: qty 250

## 2014-03-25 MED ORDER — METRONIDAZOLE 500 MG PO TABS: 500.0000 mg | ORAL_TABLET | Freq: Two times a day (BID) | ORAL | Status: DC

## 2014-03-25 MED ORDER — CIPROFLOXACIN HCL 500 MG PO TABS: 500.0000 mg | ORAL_TABLET | Freq: Two times a day (BID) | ORAL | Status: DC

## 2014-03-25 MED ORDER — NAPROXEN 500 MG PO TABS: 500.0000 mg | ORAL_TABLET | Freq: Two times a day (BID) | ORAL | Status: DC

## 2014-03-25 MED ORDER — AZITHROMYCIN 250 MG PO TABS
1000.0000 mg | ORAL_TABLET | Freq: Once | ORAL | Status: AC
  Administered 2014-03-25: 1000 mg via ORAL
  Filled 2014-03-25: qty 4

## 2014-03-25 MED ORDER — LIDOCAINE HCL (PF) 1 % IJ SOLN
INTRAMUSCULAR | Status: AC
  Administered 2014-03-25: 5 mL
  Filled 2014-03-25: qty 5

## 2014-03-25 NOTE — ED Notes (Signed)
Tried to call Telephonic Interpreting and the line was busy.

## 2014-03-25 NOTE — Discharge Instructions (Signed)
Take antibiotics as directed. When antibiotic is for urinary tract infection. The other is for a bacterial infection in your vaginal area. Pregnancy test was negative. You should improve over the next 2-3 days. Return for any newer worse symptoms. Cultures from your vaginal area are pending. Take the Naprosyn as needed for pain.

## 2014-03-25 NOTE — ED Provider Notes (Signed)
CSN: [id number]     Arrival date & time 03/25/14  1330 History   First MD Initiated Contact with Patient 03/25/14 1649     Chief Complaint  Patient presents with  . Vaginal Discharge     (Consider location/radiation/quality/duration/timing/severity/associated sxs/prior Treatment) Patient is a 23 y.o. female presenting with vaginal discharge. The history is provided by the patient.  Vaginal Discharge Associated symptoms: no abdominal pain, no dysuria and no fever    patient here for vaginal discharge itching. Denied any bleeding to the knee. States symptoms started on Sunday so it's 4 days. Patient without any abdominal or pelvic pain. No back pain not hurting to urinate no fevers. Patient does report that she recently was taking Flagyl but seems like that was several days ago.  Past Medical History  Diagnosis Date  . No pertinent past medical history    Past Surgical History  Procedure Laterality Date  . No past surgeries     History reviewed. No pertinent family history. History  Substance Use Topics  . Smoking status: Never Smoker   . Smokeless tobacco: Never Used  . Alcohol Use: No   OB History   Grav Para Term Preterm Abortions TAB SAB Ect Mult Living   7 6 6  1  1   6      Review of Systems  Constitutional: Negative for fever.  HENT: Negative for congestion.   Eyes: Negative for redness.  Respiratory: Negative for shortness of breath.   Cardiovascular: Negative for chest pain.  Gastrointestinal: Negative for abdominal pain.  Genitourinary: Positive for vaginal discharge. Negative for dysuria, vaginal bleeding and vaginal pain.  Musculoskeletal: Negative for back pain.  Skin: Negative for rash.  Neurological: Negative for headaches.  Hematological: Does not bruise/bleed easily.  Psychiatric/Behavioral: Negative for confusion.      Allergies  Review of patient's allergies indicates no known allergies.  Home Medications   Prior to Admission medications    Medication Sig Start Date End Date Taking? Authorizing Provider  ciprofloxacin (CIPRO) 500 MG tablet Take 1 tablet (500 mg total) by mouth 2 (two) times daily. 03/25/14   Vanetta Mulders, MD  metroNIDAZOLE (FLAGYL) 500 MG tablet Take 1 tablet (500 mg total) by mouth 2 (two) times daily. 03/25/14   Vanetta Mulders, MD  naproxen (NAPROSYN) 500 MG tablet Take 1 tablet (500 mg total) by mouth 2 (two) times daily. 03/25/14   Vanetta Mulders, MD   BP 99/58  Pulse 90  Temp(Src) 98 F (36.7 C) (Oral)  Resp 18  SpO2 100% Physical Exam  Nursing note and vitals reviewed. Constitutional: She is oriented to person, place, and time. She appears well-developed and well-nourished. No distress.  HENT:  Head: Normocephalic and atraumatic.  Mouth/Throat: Oropharynx is clear and moist.  Eyes: Conjunctivae and EOM are normal. Pupils are equal, round, and reactive to light.  Neck: Normal range of motion.  Pulmonary/Chest: Effort normal and breath sounds normal. No respiratory distress.  Abdominal: Soft. Bowel sounds are normal. There is no tenderness.  Genitourinary: Uterus normal. Vaginal discharge found.  Significant purulent discharge from the cervix. No cervical motion tenderness no uterine tenderness no adnexal tenderness. No external lesions.  Musculoskeletal: Normal range of motion. She exhibits no edema.  Neurological: She is alert and oriented to person, place, and time. No cranial nerve deficit. Coordination normal.    ED Course  Procedures (including critical care time) Labs Review Labs Reviewed  WET PREP, GENITAL - Abnormal; Notable for the following:    Clue  Cells Wet Prep HPF POC FEW (*)    WBC, Wet Prep HPF POC TOO NUMEROUS TO COUNT (*)    All other components within normal limits  URINALYSIS, ROUTINE W REFLEX MICROSCOPIC - Abnormal; Notable for the following:    APPearance CLOUDY (*)    Nitrite POSITIVE (*)    Leukocytes, UA LARGE (*)    All other components within normal limits  URINE  MICROSCOPIC-ADD ON - Abnormal; Notable for the following:    Squamous Epithelial / LPF MANY (*)    Bacteria, UA MANY (*)    All other components within normal limits  GC/CHLAMYDIA PROBE AMP  PREGNANCY, URINE  HIV ANTIBODY (ROUTINE TESTING)   Results for orders placed during the hospital encounter of 03/25/14  WET PREP, GENITAL      Result Value Ref Range   Yeast Wet Prep HPF POC NONE SEEN  NONE SEEN   Trich, Wet Prep NONE SEEN  NONE SEEN   Clue Cells Wet Prep HPF POC FEW (*) NONE SEEN   WBC, Wet Prep HPF POC TOO NUMEROUS TO COUNT (*) NONE SEEN  URINALYSIS, ROUTINE W REFLEX MICROSCOPIC      Result Value Ref Range   Color, Urine YELLOW  YELLOW   APPearance CLOUDY (*) CLEAR   Specific Gravity, Urine 1.024  1.005 - 1.030   pH 6.5  5.0 - 8.0   Glucose, UA NEGATIVE  NEGATIVE mg/dL   Hgb urine dipstick NEGATIVE  NEGATIVE   Bilirubin Urine NEGATIVE  NEGATIVE   Ketones, ur NEGATIVE  NEGATIVE mg/dL   Protein, ur NEGATIVE  NEGATIVE mg/dL   Urobilinogen, UA 0.2  0.0 - 1.0 mg/dL   Nitrite POSITIVE (*) NEGATIVE   Leukocytes, UA LARGE (*) NEGATIVE  PREGNANCY, URINE      Result Value Ref Range   Preg Test, Ur NEGATIVE  NEGATIVE  URINE MICROSCOPIC-ADD ON      Result Value Ref Range   Squamous Epithelial / LPF MANY (*) RARE   WBC, UA 11-20  <3 WBC/hpf   Bacteria, UA MANY (*) RARE   Urine-Other MUCOUS PRESENT       Imaging Review No results found.   EKG Interpretation None      MDM   Final diagnoses:  UTI (lower urinary tract infection)  Concern about STD in female without diagnosis  Bacterial vaginosis    The patient was significant cervical purulent discharge. Clinically no evidence of PID no uterine tenderness no adnexal tenderness. Will treat his STD exposure. Patient given Rocephin IM here and one gram of Zithromax. Patient's wet prep has a few clue cells so we'll going treat with Flagyl as well. Patient's urinalysis is positive nitrite which could be consistent with  urinary tract infection so we'll treat with Cipro. Patient also treated with Naprosyn for the discomfort. HIV test is pending vaginal cultures are pending.   Vanetta MuldersScott Tyee Vandevoorde, MD 03/25/14 [phone]

## 2014-03-25 NOTE — ED Notes (Signed)
Pelvic Cart set up for MD to complete pelvic exam.

## 2014-03-25 NOTE — ED Notes (Signed)
Spoke with patient utilizing the translator phone.

## 2014-03-25 NOTE — ED Notes (Signed)
Phlebotomy at the bedside  

## 2014-03-25 NOTE — ED Notes (Signed)
Translator utilized for discharge teaching. Patient verbalized understanding.

## 2014-03-25 NOTE — ED Notes (Signed)
Pt reports having vaginal discharge, itching and mild bleeding since Monday. No acute distress noted at triage. Reports recently taking flagyl for same.

## 2014-03-26 LAB — GC/CHLAMYDIA PROBE AMP
CT PROBE, AMP APTIMA: NEGATIVE
GC PROBE AMP APTIMA: NEGATIVE

## 2014-03-26 LAB — HIV ANTIBODY (ROUTINE TESTING W REFLEX): HIV: NONREACTIVE

## 2014-08-24 ENCOUNTER — Encounter (HOSPITAL_COMMUNITY): Payer: Self-pay | Admitting: Emergency Medicine

## 2015-09-29 ENCOUNTER — Emergency Department (HOSPITAL_COMMUNITY)
Admission: EM | Admit: 2015-09-29 | Discharge: 2015-09-29 | Disposition: A | Discharge status: Home / Self Care | Payer: Self-pay | Attending: Emergency Medicine | Admitting: Emergency Medicine

## 2015-09-29 ENCOUNTER — Encounter (HOSPITAL_COMMUNITY): Payer: Self-pay | Admitting: Emergency Medicine

## 2015-09-29 ENCOUNTER — Emergency Department (HOSPITAL_COMMUNITY): Payer: Medicaid Other

## 2015-09-29 DIAGNOSIS — B9689 Other specified bacterial agents as the cause of diseases classified elsewhere: Secondary | ICD-10-CM

## 2015-09-29 DIAGNOSIS — R102 Pelvic and perineal pain: Secondary | ICD-10-CM

## 2015-09-29 DIAGNOSIS — R197 Diarrhea, unspecified: Secondary | ICD-10-CM | POA: Insufficient documentation

## 2015-09-29 DIAGNOSIS — N760 Acute vaginitis: Secondary | ICD-10-CM | POA: Insufficient documentation

## 2015-09-29 DIAGNOSIS — Z3202 Encounter for pregnancy test, result negative: Secondary | ICD-10-CM | POA: Insufficient documentation

## 2015-09-29 DIAGNOSIS — N390 Urinary tract infection, site not specified: Secondary | ICD-10-CM | POA: Insufficient documentation

## 2015-09-29 DIAGNOSIS — R51 Headache: Secondary | ICD-10-CM | POA: Insufficient documentation

## 2015-09-29 LAB — COMPREHENSIVE METABOLIC PANEL
ALK PHOS: 81 U/L (ref 38–126)
ALT: 25 U/L (ref 14–54)
AST: 25 U/L (ref 15–41)
Albumin: 4.3 g/dL (ref 3.5–5.0)
Anion gap: 8 (ref 5–15)
BILIRUBIN TOTAL: 0.6 mg/dL (ref 0.3–1.2)
BUN: 10 mg/dL (ref 6–20)
CALCIUM: 9.6 mg/dL (ref 8.9–10.3)
CO2: 28 mmol/L (ref 22–32)
CREATININE: 0.58 mg/dL (ref 0.44–1.00)
Chloride: 102 mmol/L (ref 101–111)
Glucose, Bld: 106 mg/dL — ABNORMAL HIGH (ref 65–99)
Potassium: 3.7 mmol/L (ref 3.5–5.1)
Sodium: 138 mmol/L (ref 135–145)
TOTAL PROTEIN: 8.3 g/dL — AB (ref 6.5–8.1)

## 2015-09-29 LAB — URINALYSIS, ROUTINE W REFLEX MICROSCOPIC
Bilirubin Urine: NEGATIVE
Glucose, UA: NEGATIVE mg/dL
Ketones, ur: 15 mg/dL — AB
NITRITE: NEGATIVE
PROTEIN: 30 mg/dL — AB
Specific Gravity, Urine: 1.028 (ref 1.005–1.030)
pH: 6 (ref 5.0–8.0)

## 2015-09-29 LAB — URINE MICROSCOPIC-ADD ON

## 2015-09-29 LAB — CBC
HCT: 38.6 % (ref 36.0–46.0)
Hemoglobin: 12.9 g/dL (ref 12.0–15.0)
MCH: 28 pg (ref 26.0–34.0)
MCHC: 33.4 g/dL (ref 30.0–36.0)
MCV: 83.7 fL (ref 78.0–100.0)
PLATELETS: 411 10*3/uL — AB (ref 150–400)
RBC: 4.61 MIL/uL (ref 3.87–5.11)
RDW: 13.4 % (ref 11.5–15.5)
WBC: 10.2 10*3/uL (ref 4.0–10.5)

## 2015-09-29 LAB — WET PREP, GENITAL
Sperm: NONE SEEN
Trich, Wet Prep: NONE SEEN
Yeast Wet Prep HPF POC: NONE SEEN

## 2015-09-29 LAB — POC URINE PREG, ED: Preg Test, Ur: NEGATIVE

## 2015-09-29 LAB — LIPASE, BLOOD: Lipase: 21 U/L (ref 11–51)

## 2015-09-29 MED ORDER — NITROFURANTOIN MONOHYD MACRO 100 MG PO CAPS: 100.0000 mg | ORAL_CAPSULE | Freq: Two times a day (BID) | ORAL | Status: DC

## 2015-09-29 MED ORDER — METRONIDAZOLE 500 MG PO TABS
2000.0000 mg | ORAL_TABLET | Freq: Once | ORAL | Status: AC
  Administered 2015-09-29: 2000 mg via ORAL
  Filled 2015-09-29: qty 4

## 2015-09-29 MED ORDER — ONDANSETRON HCL 4 MG/2ML IJ SOLN
4.0000 mg | Freq: Once | INTRAMUSCULAR | Status: AC
  Administered 2015-09-29: 4 mg via INTRAVENOUS
  Filled 2015-09-29: qty 2

## 2015-09-29 MED ORDER — SODIUM CHLORIDE 0.9 % IV BOLUS (SEPSIS)
1000.0000 mL | Freq: Once | INTRAVENOUS | Status: AC
  Administered 2015-09-29: 1000 mL via INTRAVENOUS

## 2015-09-29 MED ORDER — FENTANYL CITRATE (PF) 100 MCG/2ML IJ SOLN
50.0000 ug | Freq: Once | INTRAMUSCULAR | Status: AC
  Administered 2015-09-29: 50 ug via INTRAVENOUS
  Filled 2015-09-29: qty 2

## 2015-09-29 MED ORDER — IBUPROFEN 400 MG PO TABS: 400.0000 mg | ORAL_TABLET | Freq: Once | ORAL | Status: DC

## 2015-09-29 MED ORDER — NITROFURANTOIN MONOHYD MACRO 100 MG PO CAPS
100.0000 mg | ORAL_CAPSULE | Freq: Once | ORAL | Status: AC
  Administered 2015-09-29: 100 mg via ORAL
  Filled 2015-09-29: qty 1

## 2015-09-29 MED ORDER — ONDANSETRON 4 MG PO TBDP: 4.0000 mg | ORAL_TABLET | Freq: Once | ORAL | Status: DC

## 2015-09-29 NOTE — ED Notes (Signed)
Pt sts N/V/D x 2 days with HA

## 2015-09-29 NOTE — ED Provider Notes (Signed)
CSN: [id number]     Arrival date & time 09/29/15  1152 History   First MD Initiated Contact with Patient 09/29/15 1651     Chief Complaint  Patient presents with  . Emesis  . Diarrhea  . Headache   Patient is a 24 y.o. female presenting with vomiting. The history is provided by the patient.  Emesis Severity:  Mild Timing:  Intermittent Progression:  Unchanged Chronicity:  New Relieved by:  Nothing Associated symptoms: abdominal pain, diarrhea and headaches   Associated symptoms: no fever and no sore throat   Risk factors: not pregnant now     Past Medical History  Diagnosis Date  . No pertinent past medical history    Past Surgical History  Procedure Laterality Date  . No past surgeries     History reviewed. No pertinent family history. Social History  Substance Use Topics  . Smoking status: Never Smoker   . Smokeless tobacco: Never Used  . Alcohol Use: No   OB History    Gravida Para Term Preterm AB TAB SAB Ectopic Multiple Living   Review of Systems  Constitutional: Negative for fever.  HENT: Negative for rhinorrhea and sore throat.   Eyes: Negative for visual disturbance.  Respiratory: Negative for chest tightness and shortness of breath.   Cardiovascular: Negative for chest pain and palpitations.  Gastrointestinal: Positive for vomiting, abdominal pain and diarrhea. Negative for nausea and constipation.  Genitourinary: Negative for dysuria and hematuria.  Musculoskeletal: Negative for back pain and neck pain.  Skin: Negative for rash.  Neurological: Positive for headaches. Negative for dizziness.  Psychiatric/Behavioral: Negative for confusion.  All other systems reviewed and are negative.  Allergies  Review of patient's allergies indicates no known allergies.  Home Medications   Prior to Admission medications   Medication Sig Start Date End Date Taking? Authorizing Provider  ciprofloxacin (CIPRO) 500 MG tablet Take 1 tablet (500 mg  total) by mouth 2 (two) times daily. Patient not taking: Reported on 09/29/2015 03/25/14   Vanetta Mulders, MD  metroNIDAZOLE (FLAGYL) 500 MG tablet Take 1 tablet (500 mg total) by mouth 2 (two) times daily. Patient not taking: Reported on 09/29/2015 03/25/14   Vanetta Mulders, MD  naproxen (NAPROSYN) 500 MG tablet Take 1 tablet (500 mg total) by mouth 2 (two) times daily. Patient not taking: Reported on 09/29/2015 03/25/14   Vanetta Mulders, MD  nitrofurantoin, macrocrystal-monohydrate, (MACROBID) 100 MG capsule Take 1 capsule (100 mg total) by mouth 2 (two) times daily. 09/29/15   Maris Berger, MD   BP 106/54 mmHg  Pulse 43  Temp(Src) 97.5 F (36.4 C) (Oral)  Resp 18  SpO2 99% Physical Exam  Constitutional: She is oriented to person, place, and time. She appears well-developed and well-nourished. No distress.  HENT:  Head: Normocephalic and atraumatic.  Mouth/Throat: Oropharynx is clear and moist.  Eyes: EOM are normal. Pupils are equal, round, and reactive to light.  Neck: Neck supple. No JVD present.  Cardiovascular: Normal rate, regular rhythm, normal heart sounds and intact distal pulses.  Exam reveals no gallop.   No murmur heard. Pulmonary/Chest: Effort normal and breath sounds normal. She has no wheezes. She has no rales.  Abdominal: Soft. She exhibits no distension. There is tenderness in the left lower quadrant. There is no rigidity and no guarding.  Genitourinary: Cervix exhibits no motion tenderness and no discharge. Right adnexum displays no tenderness. Left adnexum displays tenderness.  No vaginal discharge found.  IUD strings visible  Musculoskeletal: Normal range of motion. She exhibits no tenderness.  Neurological: She is alert and oriented to person, place, and time. No cranial nerve deficit. She exhibits normal muscle tone.  Skin: Skin is warm and dry. No rash noted.  Psychiatric: Her behavior is normal.    ED Course  Procedures  None Labs Review Labs Reviewed  WET  PREP, GENITAL - Abnormal; Notable for the following:    Clue Cells Wet Prep HPF POC PRESENT (*)    WBC, Wet Prep HPF POC MANY (*)    All other components within normal limits  COMPREHENSIVE METABOLIC PANEL - Abnormal; Notable for the following:    Glucose, Bld 106 (*)    Total Protein 8.3 (*)    All other components within normal limits  CBC - Abnormal; Notable for the following:    Platelets 411 (*)    All other components within normal limits  URINALYSIS, ROUTINE W REFLEX MICROSCOPIC (NOT AT [facility]) - Abnormal; Notable for the following:    APPearance CLOUDY (*)    Hgb urine dipstick SMALL (*)    Ketones, ur 15 (*)    Protein, ur 30 (*)    Leukocytes, UA MODERATE (*)    All other components within normal limits  URINE MICROSCOPIC-ADD ON - Abnormal; Notable for the following:    Squamous Epithelial / LPF 6-30 (*)    Bacteria, UA FEW (*)    All other components within normal limits  LIPASE, BLOOD  POC URINE PREG, ED  GC/CHLAMYDIA PROBE AMP ([facility]) NOT AT [facility]    Imaging Review US Transvaginal Non-ob  09/29/2015  CLINICAL DATA:  Left adnexal pain and tenderness for 2 days. IUD. Unknown LMP. EXAM: TRANSABDOMINAL AND TRANSVAGINAL ULTRASOUND OF PELVIS TECHNIQUE: Both transabdominal and transvaginal ultrasound examinations of the pelvis were performed. Transabdominal technique was performed for global imaging of the pelvis including uterus, ovaries, adnexal regions, and pelvic cul-de-sac. It was necessary to proceed with endovaginal exam following the transabdominal exam to visualize the IUD location and ovaries. COMPARISON:  None FINDINGS: Uterus Measurements: 9.0 x 4.1 x 5.7 cm. No fibroids identified. Tiny echogenic foci at endometrial-myometrial junction are suspicious for adenomyosis. Endometrium Thickness: IUD seen within the endometrial cavity. Tiny amount of fluid also noted within endometrial cavity surrounding the IUD. Right ovary Measurements: 2.2 x 1.6 x 1.4 cm. Normal  appearance/no adnexal mass. Left ovary Measurements: 2.7 x 2.0 x 1.5 cm. Normal appearance/no adnexal mass. Other findings No free fluid. IMPRESSION: Probable mild uterine adenomyosis.  No fibroids identified. IUD visualized within endometrial cavity. Normal appearance of both ovaries.  No adnexal mass identified. Electronically Signed   By: Myles Rosenthal M.D.   On: 09/29/2015 18:26   US Pelvis Complete  09/29/2015  CLINICAL DATA:  Left adnexal pain and tenderness for 2 days. IUD. Unknown LMP. EXAM: TRANSABDOMINAL AND TRANSVAGINAL ULTRASOUND OF PELVIS TECHNIQUE: Both transabdominal and transvaginal ultrasound examinations of the pelvis were performed. Transabdominal technique was performed for global imaging of the pelvis including uterus, ovaries, adnexal regions, and pelvic cul-de-sac. It was necessary to proceed with endovaginal exam following the transabdominal exam to visualize the IUD location and ovaries. COMPARISON:  None FINDINGS: Uterus Measurements: 9.0 x 4.1 x 5.7 cm. No fibroids identified. Tiny echogenic foci at endometrial-myometrial junction are suspicious for adenomyosis. Endometrium Thickness: IUD seen within the endometrial cavity. Tiny amount of fluid also noted within endometrial cavity surrounding the IUD. Right ovary Measurements: 2.2 x  1.6 x 1.4 cm. Normal appearance/no adnexal mass. Left ovary Measurements: 2.7 x 2.0 x 1.5 cm. Normal appearance/no adnexal mass. Other findings No free fluid. IMPRESSION: Probable mild uterine adenomyosis.  No fibroids identified. IUD visualized within endometrial cavity. Normal appearance of both ovaries.  No adnexal mass identified. Electronically Signed   By: Myles RosenthalJohn  Stahl M.D.   On: 09/29/2015 18:26   I have personally reviewed and evaluated these images and lab results as part of my medical decision-making.  MDM   Final diagnoses:  UTI (lower urinary tract infection)  Bacterial vaginitis    Patient is a 24 year old Hispanic female with history of  multiple sexual partners who presents with abdominal burning, diarrhea, vomiting and headache. Symptom onset was yesterday. Denies vaginal bleeding or discharge, dysuria, bloody stool or emesis, flank pain or hematuria, fevers. No history of STD. Afebrile, vitals stable. Tenderness to palpation in the left lower quadrant without peritonitis. Pelvic exam demonstrates an IUD with no cervical discharge or cervical motion tenderness. She does have left adnexal tenderness. Wet prep with positive clue cells. Treated with Flagyl by mouth 2000 g. UA concerning for UTI. Given Macrobid prescription. Pelvic ultrasound obtained to rule out tubo-ovarian abscess or torsion. Ends concerning for adenomyosis for which I recommended she follow up with OB/GYN. Pain controlled here. Feel she is stable for discharge home at this time. She voices understanding and is agreeable with discharge plan.  Discussed with Dr. Idolina PrimerBeaton  Maili Shutters, MD 09/30/15 [id number]  Nelva Nayobert Beaton, MD 09/30/15 [id number]

## 2015-09-30 LAB — GC/CHLAMYDIA PROBE AMP (~~LOC~~) NOT AT ARMC
Chlamydia: NEGATIVE
NEISSERIA GONORRHEA: NEGATIVE

## 2016-06-26 IMAGING — US US TRANSVAGINAL NON-OB
1 series · 13 of 25 positions shown · non-contrast
Comparison: None

CLINICAL DATA: Left adnexal pain and tenderness for 2 days. IUD.
Unknown LMP.

EXAM:
TRANSABDOMINAL AND TRANSVAGINAL ULTRASOUND OF PELVIS
TECHNIQUE: Both transabdominal and transvaginal ultrasound examinations of the
pelvis were performed. Transabdominal technique was performed for
global imaging of the pelvis including uterus, ovaries, adnexal
regions, and pelvic cul-de-sac. It was necessary to proceed with
endovaginal exam following the transabdominal exam to visualize the
IUD location and ovaries.

[Series 1: us transvaginal non-ob · 0.21mm/px · 13 of 79 slices shown]
[im 1/79]
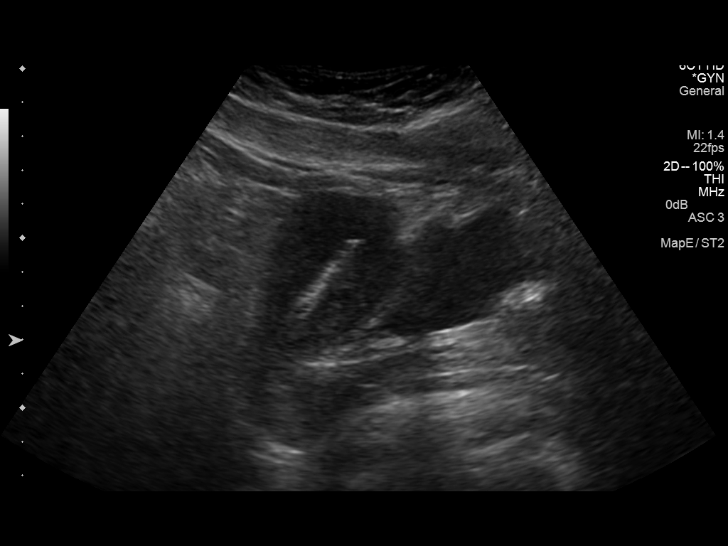
[im 7/79]
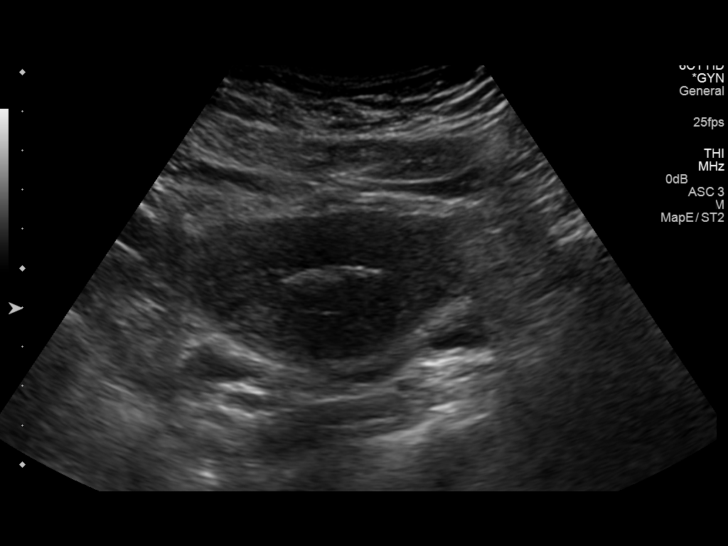
[im 14/79]
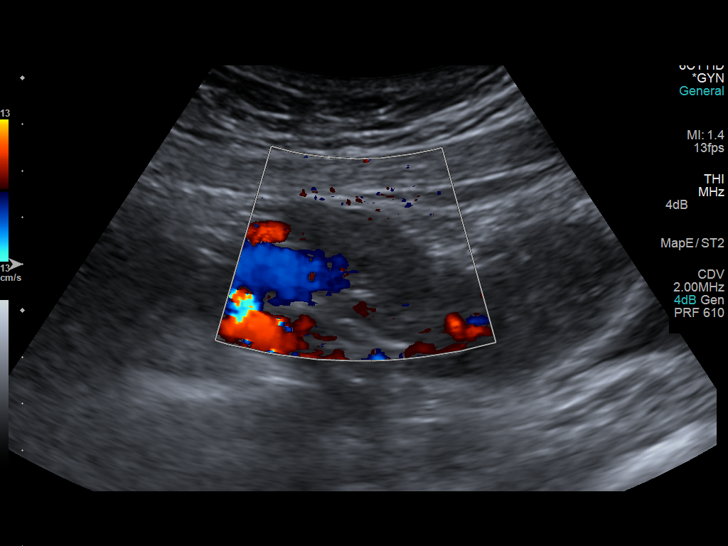
[im 20/79]
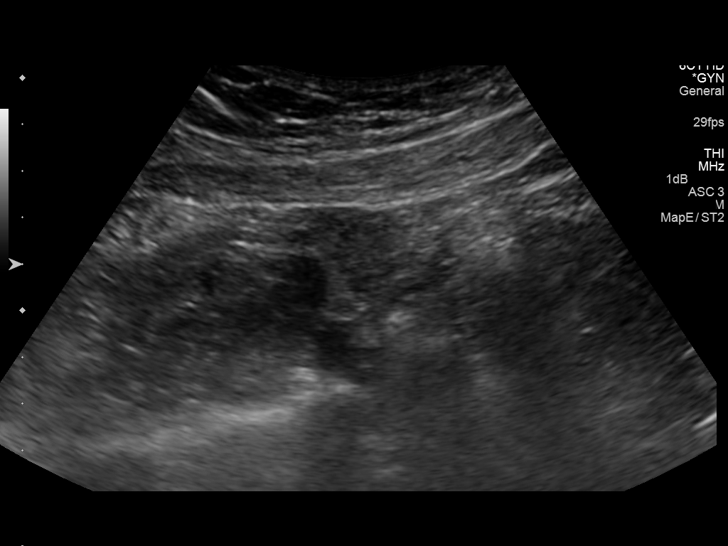
[im 27/79]
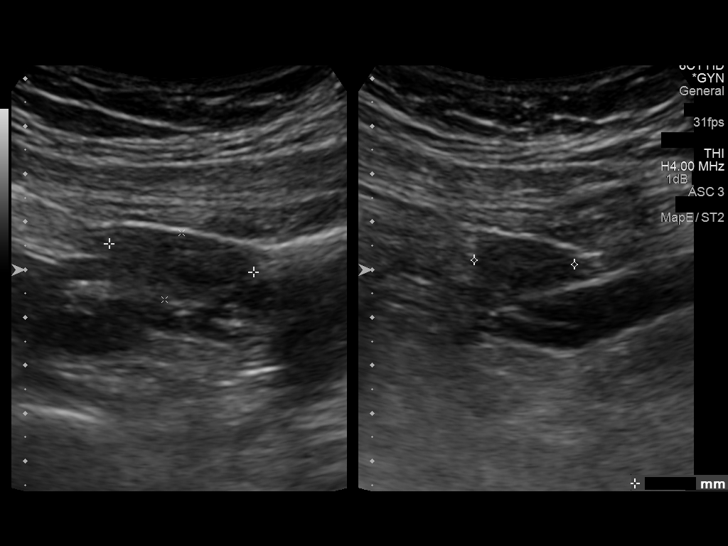
[im 33/79]
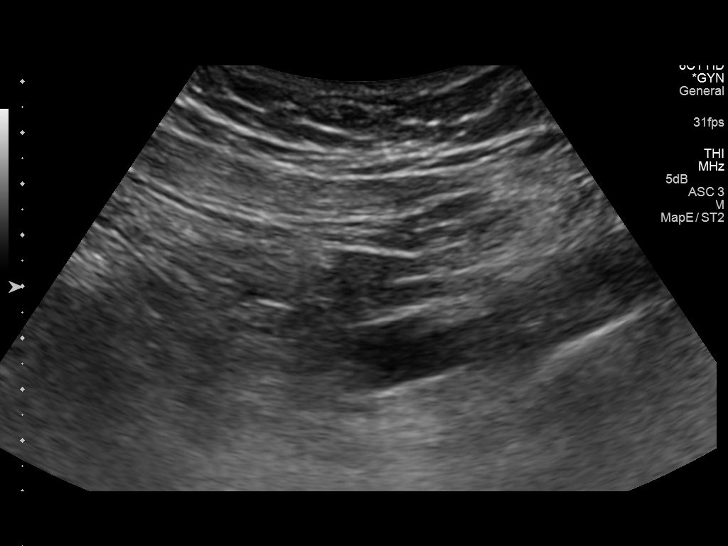
[im 40/79]
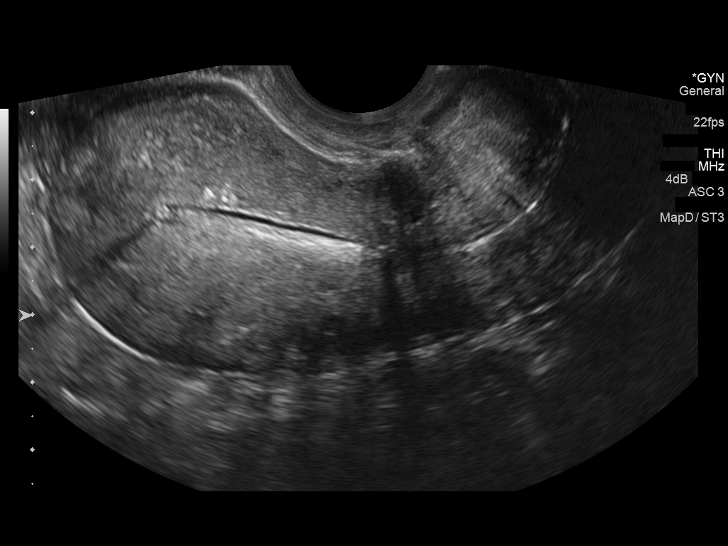
[im 46/79]
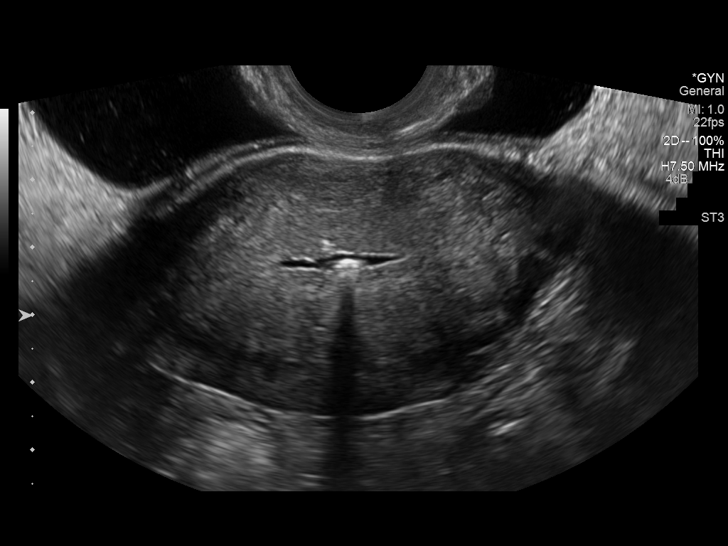
[im 53/79]
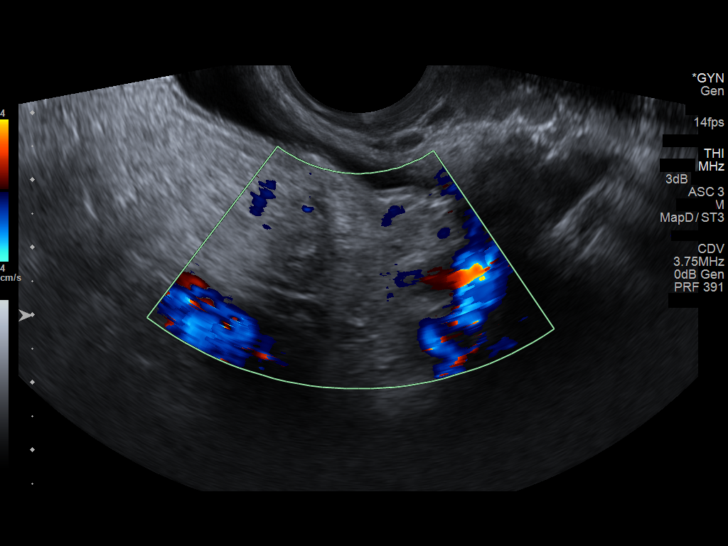
[im 59/79]
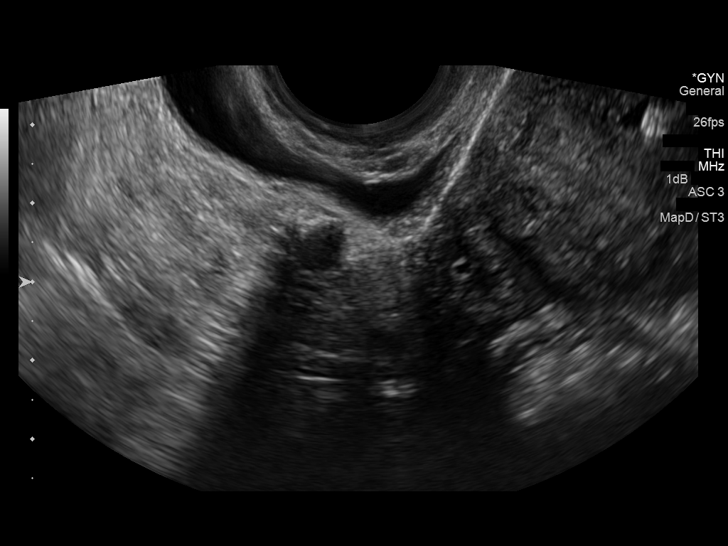
[im 66/79]
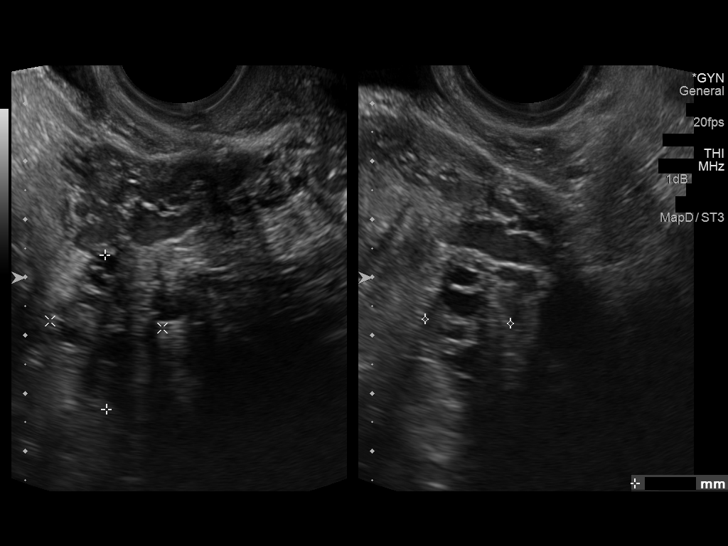
[im 72/79]
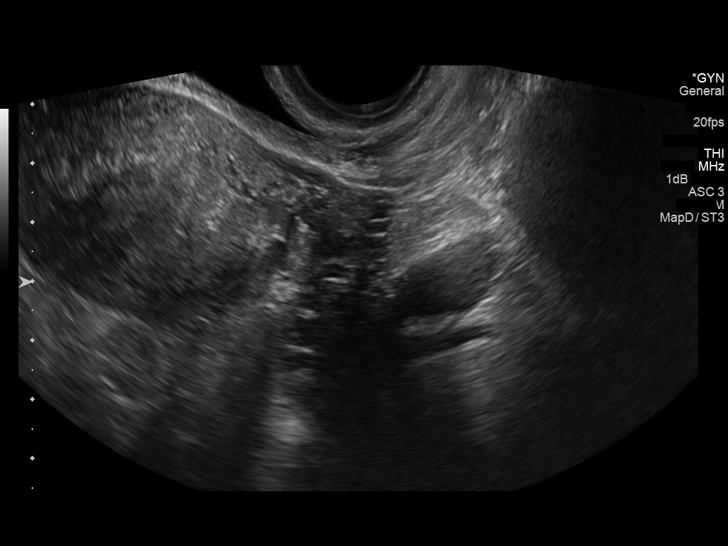
[im 79/79]
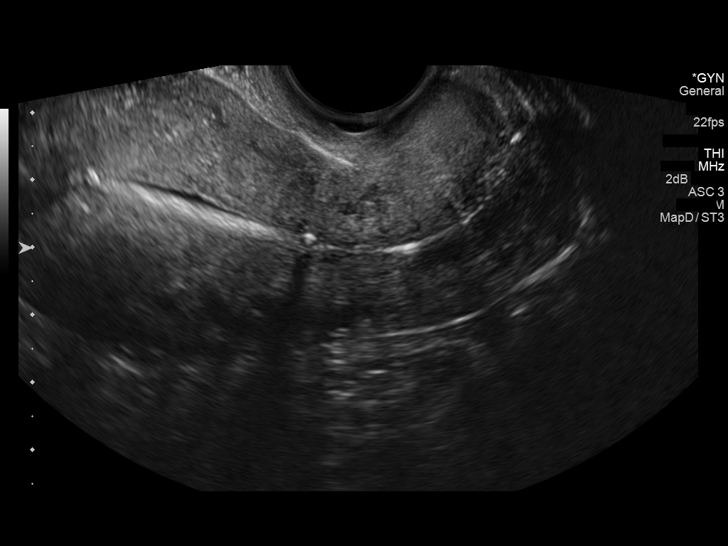

[13 of 25 positions shown; findings below may reference images not displayed]

FINDINGS: Uterus

Measurements: 9.0 x 4.1 x 5.7 cm. No fibroids identified. Tiny
echogenic foci at endometrial-myometrial junction are suspicious for
adenomyosis.

Endometrium

Thickness: IUD seen within the endometrial cavity. Tiny amount of
fluid also noted within endometrial cavity surrounding the IUD.

Right ovary

Measurements: 2.2 x 1.6 x 1.4 cm. Normal appearance/no adnexal mass.

Left ovary

Measurements: 2.7 x 2.0 x 1.5 cm. Normal appearance/no adnexal mass.

Other findings

No free fluid.
IMPRESSION: Probable mild uterine adenomyosis.  No fibroids identified.

IUD visualized within endometrial cavity.

Normal appearance of both ovaries.  No adnexal mass identified.

## 2017-09-09 ENCOUNTER — Encounter (HOSPITAL_COMMUNITY): Payer: Self-pay

## 2017-09-09 ENCOUNTER — Emergency Department (HOSPITAL_COMMUNITY): Payer: Self-pay

## 2017-09-09 ENCOUNTER — Emergency Department (HOSPITAL_COMMUNITY)
Admission: EM | Admit: 2017-09-09 | Discharge: 2017-09-10 | Disposition: A | Discharge status: Home / Self Care | Payer: Self-pay | Attending: Emergency Medicine | Admitting: Emergency Medicine

## 2017-09-09 DIAGNOSIS — N3001 Acute cystitis with hematuria: Secondary | ICD-10-CM | POA: Insufficient documentation

## 2017-09-09 DIAGNOSIS — N739 Female pelvic inflammatory disease, unspecified: Secondary | ICD-10-CM | POA: Insufficient documentation

## 2017-09-09 DIAGNOSIS — R102 Pelvic and perineal pain: Secondary | ICD-10-CM

## 2017-09-09 DIAGNOSIS — N730 Acute parametritis and pelvic cellulitis: Secondary | ICD-10-CM

## 2017-09-09 LAB — URINALYSIS, ROUTINE W REFLEX MICROSCOPIC
BACTERIA UA: NONE SEEN
Bilirubin Urine: NEGATIVE
GLUCOSE, UA: NEGATIVE mg/dL
Ketones, ur: 20 mg/dL — AB
Nitrite: NEGATIVE
Protein, ur: >=300 mg/dL — AB
Specific Gravity, Urine: 1.019 (ref 1.005–1.030)
pH: 9 — ABNORMAL HIGH (ref 5.0–8.0)

## 2017-09-09 LAB — COMPREHENSIVE METABOLIC PANEL
ALBUMIN: 4.2 g/dL (ref 3.5–5.0)
ALT: 27 U/L (ref 14–54)
AST: 29 U/L (ref 15–41)
Alkaline Phosphatase: 92 U/L (ref 38–126)
Anion gap: 9 (ref 5–15)
BUN: 9 mg/dL (ref 6–20)
CO2: 20 mmol/L — ABNORMAL LOW (ref 22–32)
Calcium: 8.9 mg/dL (ref 8.9–10.3)
Chloride: 108 mmol/L (ref 101–111)
Creatinine, Ser: 0.49 mg/dL (ref 0.44–1.00)
GFR calc Af Amer: >60 mL/min (ref >60)
GFR calc non Af Amer: >60 mL/min (ref >60)
GLUCOSE: 94 mg/dL (ref 65–99)
POTASSIUM: 3.2 mmol/L — AB (ref 3.5–5.1)
Sodium: 137 mmol/L (ref 135–145)
Total Bilirubin: 0.5 mg/dL (ref 0.3–1.2)
Total Protein: 8 g/dL (ref 6.5–8.1)

## 2017-09-09 LAB — WET PREP, GENITAL
Sperm: NONE SEEN
Trich, Wet Prep: NONE SEEN

## 2017-09-09 LAB — CBC
HEMATOCRIT: 38 % (ref 36.0–46.0)
HEMOGLOBIN: 13 g/dL (ref 12.0–15.0)
MCH: 28.8 pg (ref 26.0–34.0)
MCHC: 34.2 g/dL (ref 30.0–36.0)
MCV: 84.1 fL (ref 78.0–100.0)
Platelets: 428 10*3/uL — ABNORMAL HIGH (ref 150–400)
RBC: 4.52 MIL/uL (ref 3.87–5.11)
RDW: 13 % (ref 11.5–15.5)
WBC: 17 10*3/uL — ABNORMAL HIGH (ref 4.0–10.5)

## 2017-09-09 LAB — I-STAT BETA HCG BLOOD, ED (MC, WL, AP ONLY)

## 2017-09-09 LAB — LIPASE, BLOOD: Lipase: 34 U/L (ref 11–51)

## 2017-09-09 MED ORDER — KETOROLAC TROMETHAMINE 15 MG/ML IJ SOLN
15.0000 mg | Freq: Once | INTRAMUSCULAR | Status: AC
  Administered 2017-09-09: 15 mg via INTRAVENOUS
  Filled 2017-09-09: qty 1

## 2017-09-09 MED ORDER — DEXTROSE 5 % IV SOLN
1.0000 g | Freq: Once | INTRAVENOUS | Status: AC
  Administered 2017-09-09: 1 g via INTRAVENOUS
  Filled 2017-09-09: qty 10

## 2017-09-09 MED ORDER — FLUCONAZOLE 100 MG PO TABS
150.0000 mg | ORAL_TABLET | Freq: Once | ORAL | Status: AC
  Administered 2017-09-09: 150 mg via ORAL
  Filled 2017-09-09: qty 2

## 2017-09-09 MED ORDER — SODIUM CHLORIDE 0.9 % IV SOLN
INTRAVENOUS | Status: DC
  Administered 2017-09-09: 23:00:00 via INTRAVENOUS

## 2017-09-09 MED ORDER — ONDANSETRON HCL 4 MG/2ML IJ SOLN
4.0000 mg | Freq: Once | INTRAMUSCULAR | Status: AC
  Administered 2017-09-09: 4 mg via INTRAVENOUS
  Filled 2017-09-09: qty 2

## 2017-09-09 MED ORDER — CEFTRIAXONE SODIUM 250 MG IJ SOLR
250.0000 mg | Freq: Once | INTRAMUSCULAR | Status: AC
  Administered 2017-09-10: 250 mg via INTRAMUSCULAR
  Filled 2017-09-09: qty 250

## 2017-09-09 MED ORDER — AZITHROMYCIN 250 MG PO TABS
1000.0000 mg | ORAL_TABLET | Freq: Once | ORAL | Status: AC
  Administered 2017-09-09: 1000 mg via ORAL
  Filled 2017-09-09: qty 4

## 2017-09-09 MED ORDER — LIDOCAINE HCL (PF) 1 % IJ SOLN
INTRAMUSCULAR | Status: AC
  Filled 2017-09-09: qty 5

## 2017-09-09 NOTE — ED Provider Notes (Signed)
[facility] EMERGENCY DEPARTMENT Provider Note   CSN: [id number] Arrival date & time: 09/09/17  1554     History   Chief Complaint Chief Complaint  Patient presents with  . Abdominal Pain    HPI Haley Velasquez is a 26 y.o. female who presents to the ED with abdominal pain. The pain started yesterday and has gotten progressively worse. The pain is located in the lower abdomen. Patient reports dysuria, vaginal d/c but denies fever, chills or other problems. Patient has an IUD for birth control that has been in place for 4 years.   The history is provided by the patient. A language interpreter was used.  Abdominal Pain   This is a new problem. The current episode started 2 days ago. The problem occurs constantly. The problem has been gradually worsening. The pain is at a severity of 9/10. Associated symptoms include dysuria and frequency. Pertinent negatives include nausea, vomiting and headaches. Nothing relieves the symptoms.    Past Medical History:  Diagnosis Date  . No pertinent past medical history     There are no active problems to display for this patient.   Past Surgical History:  Procedure Laterality Date  . NO PAST SURGERIES      OB History    Gravida Para Term Preterm AB Living   7 6 6   1 6    SAB TAB Ectopic Multiple Live Births   1       6       Home Medications    Prior to Admission medications   Medication Sig Start Date End Date Taking? Authorizing Provider  cephALEXin (KEFLEX) 500 MG capsule Take 1 capsule (500 mg total) 4 (four) times daily by mouth. 09/10/17   Janne Napoleon, NP  metroNIDAZOLE (FLAGYL) 500 MG tablet Take 1 tablet (500 mg total) 2 (two) times daily by mouth. 09/10/17   Caedan Sumler, Theodora Blow, NP  ondansetron (ZOFRAN ODT) 4 MG disintegrating tablet Take 1 tablet (4 mg total) every 8 (eight) hours as needed by mouth for nausea or vomiting. 09/10/17   Janne Napoleon, NP    Family History No family history on  file.  Social History Social History   Tobacco Use  . Smoking status: Never Smoker  . Smokeless tobacco: Never Used  Substance Use Topics  . Alcohol use: No  . Drug use: No     Allergies   Patient has no known allergies.   Review of Systems Review of Systems  Constitutional: Negative for chills and fatigue.  HENT: Negative.   Eyes: Negative for visual disturbance.  Respiratory: Negative for cough.   Cardiovascular: Negative for chest pain.  Gastrointestinal: Negative for abdominal pain, nausea and vomiting.  Genitourinary: Positive for dysuria, frequency and vaginal discharge.  Musculoskeletal: Negative for back pain.  Skin: Negative for rash.  Neurological: Negative for syncope and headaches.  Psychiatric/Behavioral: The patient is not nervous/anxious.      Physical Exam Updated Vital Signs BP (!) 94/53   Pulse (!) 46   Temp 98.2 F (36.8 C) (Oral)   Resp 16   SpO2 100%   Physical Exam  Constitutional: She is oriented to person, place, and time. She appears well-developed and well-nourished. No distress.  HENT:  Head: Normocephalic and atraumatic.  Eyes: EOM are normal.  Neck: Neck supple.  Pulmonary/Chest: Effort normal.  Abdominal: Soft. Normal appearance and bowel sounds are normal. There is tenderness.  Tender with palpation of lower abdomen, no guarding or  rebound.  Genitourinary: Vagina normal. Uterus is not enlarged. Cervix exhibits motion tenderness, discharge and friability.  Genitourinary Comments: IUD string visualized, there is purulent bloody discharge in the vaginal vault.  Musculoskeletal: Normal range of motion.  Neurological: She is alert and oriented to person, place, and time. No cranial nerve deficit.  Skin: Skin is warm and dry.  Nursing note and vitals reviewed.    ED Treatments / Results  Labs (all labs ordered are listed, but only abnormal results are displayed) Labs Reviewed  WET PREP, GENITAL - Abnormal; Notable for the  following components:      Result Value   Yeast Wet Prep HPF POC PRESENT (*)    Clue Cells Wet Prep HPF POC PRESENT (*)    WBC, Wet Prep HPF POC MANY (*)    All other components within normal limits  COMPREHENSIVE METABOLIC PANEL - Abnormal; Notable for the following components:   Potassium 3.2 (*)    CO2 20 (*)    All other components within normal limits  CBC - Abnormal; Notable for the following components:   WBC 17.0 (*)    Platelets 428 (*)    All other components within normal limits  URINALYSIS, ROUTINE W REFLEX MICROSCOPIC - Abnormal; Notable for the following components:   APPearance CLOUDY (*)    pH 9.0 (*)    Hgb urine dipstick MODERATE (*)    Ketones, ur 20 (*)    Protein, ur >=300 (*)    Leukocytes, UA LARGE (*)    Squamous Epithelial / LPF 0-5 (*)    Non Squamous Epithelial 0-5 (*)    All other components within normal limits  URINE CULTURE  LIPASE, BLOOD  I-STAT BETA HCG BLOOD, ED ([facility], [facility], [facility] ONLY)  GC/CHLAMYDIA PROBE AMP ([facility]) NOT [facility]    Radiology [place] Pelvis Transvanginal Non-ob (tv Only)  Result Date: 09/09/2017 CLINICAL DATA:  Initial evaluation for acute pelvic pain. EXAM: TRANSABDOMINAL AND TRANSVAGINAL ULTRASOUND OF PELVIS TECHNIQUE: Both transabdominal and transvaginal ultrasound examinations of the pelvis were performed. Transabdominal technique was performed for global imaging of the pelvis including uterus, ovaries, adnexal regions, and pelvic cul-de-sac. It was necessary to proceed with endovaginal exam following the transabdominal exam to visualize the uterus and ovaries. COMPARISON:  Prior ultrasound from 09/29/2015. FINDINGS: Uterus Measurements: 8.0 x 4.5 x 5.6 cm. No fibroids or other mass visualized. Endometrium Thickness: 18 mm. No focal abnormality visualized. IUD in place within the endometrial cavity. Right ovary Measurements: 2.9 x 1.7 x 2.8 cm. Normal appearance/no adnexal mass. Left ovary Measurements: 3.0 x 1.6 x 2.8 cm. Normal  appearance/no adnexal mass. Other findings Trace free physiologic fluid within the pelvis. Prominent vasculature seen within the left adnexa. IMPRESSION: 1. No acute abnormality identified within the pelvis. 2. Endometrial thickness is considered abnormal measuring up to 18 mm. Consider follow-up by US in 6-8 weeks, during the week immediately following menses (exam timing is critical). 3. IUD in place within the endometrial cavity. 4. Prominent vasculature within the left adnexa, which may reflect sequelae of underlying pelvic congestion syndrome. Electronically Signed   By: Rise MuBenjamin  McClintock M.D.   On: 09/09/2017 21:26   [place] Pelvis Complete  Result Date: 09/09/2017 CLINICAL DATA:  Initial evaluation for acute pelvic pain. EXAM: TRANSABDOMINAL AND TRANSVAGINAL ULTRASOUND OF PELVIS TECHNIQUE: Both transabdominal and transvaginal ultrasound examinations of the pelvis were performed. Transabdominal technique was performed for global imaging of the pelvis including uterus, ovaries, adnexal regions, and pelvic cul-de-sac. It was necessary  to proceed with endovaginal exam following the transabdominal exam to visualize the uterus and ovaries. COMPARISON:  Prior ultrasound from 09/29/2015. FINDINGS: Uterus Measurements: 8.0 x 4.5 x 5.6 cm. No fibroids or other mass visualized. Endometrium Thickness: 18 mm. No focal abnormality visualized. IUD in place within the endometrial cavity. Right ovary Measurements: 2.9 x 1.7 x 2.8 cm. Normal appearance/no adnexal mass. Left ovary Measurements: 3.0 x 1.6 x 2.8 cm. Normal appearance/no adnexal mass. Other findings Trace free physiologic fluid within the pelvis. Prominent vasculature seen within the left adnexa. IMPRESSION: 1. No acute abnormality identified within the pelvis. 2. Endometrial thickness is considered abnormal measuring up to 18 mm. Consider follow-up by US in 6-8 weeks, during the week immediately following menses (exam timing is critical). 3. IUD in place  within the endometrial cavity. 4. Prominent vasculature within the left adnexa, which may reflect sequelae of underlying pelvic congestion syndrome. Electronically Signed   By: Rise MuBenjamin  McClintock M.D.   On: 09/09/2017 21:26    Procedures Procedures (including critical care time)  Medications Ordered in ED Medications  0.9 %  sodium chloride infusion ( Intravenous Stopped 09/10/17 0125)  lidocaine (PF) (XYLOCAINE) 1 % injection (not administered)  ketorolac (TORADOL) 15 MG/ML injection 15 mg (15 mg Intravenous Given 09/09/17 2231)  cefTRIAXone (ROCEPHIN) 1 g in dextrose 5 % 50 mL IVPB (0 g Intravenous Stopped 09/09/17 2353)  ondansetron (ZOFRAN) injection 4 mg (4 mg Intravenous Given 09/09/17 2231)  cefTRIAXone (ROCEPHIN) injection 250 mg (250 mg Intramuscular Given 09/10/17 0004)  azithromycin (ZITHROMAX) tablet 1,000 mg (1,000 mg Oral Given 09/09/17 2359)  fluconazole (DIFLUCAN) tablet 150 mg (150 mg Oral Given 09/09/17 2359)     Initial Impression / Assessment and Plan / ED Course  I have reviewed the triage vital signs and the nursing notes. 26 y.o. female with pelvic pain and vaginal discharge stable for d/c without fever or acute abdomen. [facility]our[facility]. Patient voices understanding and agrees with plan. Patient states she is feeling better after the treatment in the ED. Discussed cultures pending and will call if cultures are positive.   Final Clinical Impressions(s) / ED Diagnoses   Final diagnoses:  PID (acute pelvic inflammatory disease)  Acute cystitis with hematuria  Pelvic pain in female    ED Discharge Orders        Ordered    metroNIDAZOLE (FLAGYL) 500 MG tablet  2 times daily     09/10/17 0059    cephALEXin (KEFLEX) 500 MG capsule  4 times daily     09/10/17 0059    ondansetron (ZOFRAN ODT) 4 MG disintegrating tablet  Every 8 hours PRN     09/10/17 0059       Janne NapoleonNeese, Chyenne Sobczak M, NP 09/10/17 0154    Tilden Fossaees,  Elizabeth, MD 09/11/17 [phone]

## 2017-09-09 NOTE — ED Notes (Signed)
Pelvic exam done by [facility] - NP and Kenney Housemananya - EMT assisted.

## 2017-09-09 NOTE — ED Notes (Signed)
Patient transported to Ultrasound 

## 2017-09-09 NOTE — ED Notes (Signed)
Pelvic cart set up at bedside  

## 2017-09-09 NOTE — ED Triage Notes (Signed)
Per Pt, Pt is coming from home with lower abdominal pain, vaginal pain, and vaginal bleeding. Reports some pain with urination. Denies any other vaginal discharge.

## 2017-09-10 LAB — GC/CHLAMYDIA PROBE AMP (~~LOC~~) NOT AT ARMC
CHLAMYDIA, DNA PROBE: NEGATIVE
NEISSERIA GONORRHEA: NEGATIVE

## 2017-09-10 MED ORDER — CEPHALEXIN 500 MG PO CAPS: 500.0000 mg | ORAL_CAPSULE | Freq: Four times a day (QID) | ORAL | 0 refills | Status: DC

## 2017-09-10 MED ORDER — METRONIDAZOLE 500 MG PO TABS: 500.0000 mg | ORAL_TABLET | Freq: Two times a day (BID) | ORAL | 0 refills | Status: DC

## 2017-09-10 MED ORDER — ONDANSETRON 4 MG PO TBDP: 4.0000 mg | ORAL_TABLET | Freq: Three times a day (TID) | ORAL | 0 refills | Status: DC | PRN

## 2017-09-10 NOTE — ED Notes (Signed)
PT states understanding of care given, follow up care, and medication prescribed. PT ambulated from ED to car with a steady gait. 

## 2017-09-10 NOTE — Discharge Instructions (Signed)
If your [facility] worsens [facility].  We did cultures that will not be back for 24 to 48 hours. If you need additional medication someone will call you.

## 2017-09-12 LAB — URINE CULTURE: Culture: 30000 — AB

## 2017-09-13 ENCOUNTER — Telehealth: Payer: Self-pay | Admitting: *Deleted

## 2017-09-13 NOTE — Telephone Encounter (Signed)
Post ED Visit - Positive Culture Follow-up  Culture report reviewed by antimicrobial stewardship pharmacist:  []  Haley Velasquez, Pharm.D. []  Haley Velasquez, Pharm.D., BCPS AQ-ID []  Haley Velasquez, Pharm.D., BCPS []  Haley Velasquez, Pharm.D., BCPS []  Haley Velasquez, [place]arm[place]., BCPS, AAHIVP []  Haley Velasquez, Pharm.D., BCPS, AAHIVP [x]  Haley Velasquez, PharmD, BCPS []  Haley Velasquez, PharmD, BCPS []  Haley Velasquez, PharmD, BCPS  Positive urine culture Treated with cephalexin, organism sensitive to the same and no further patient follow-up is required at this time.  Haley Velasquez, [facility]quez [facility] 09/13/2017, 2:16 PM

## 2018-06-07 IMAGING — US US PELVIS COMPLETE
1 series · 13 of 25 positions shown · non-contrast
Comparison: Prior ultrasound from 09/29/2015.

CLINICAL DATA: Initial evaluation for acute pelvic pain.

EXAM:
TRANSABDOMINAL AND TRANSVAGINAL ULTRASOUND OF PELVIS
TECHNIQUE: Both transabdominal and transvaginal ultrasound examinations of the
pelvis were performed. Transabdominal technique was performed for
global imaging of the pelvis including uterus, ovaries, adnexal
regions, and pelvic cul-de-sac. It was necessary to proceed with
endovaginal exam following the transabdominal exam to visualize the
uterus and ovaries.

[Series 1: us pelvis complete · 0.24mm/px · 70 acquisitions, 13 frames shown]
[im 1/70]
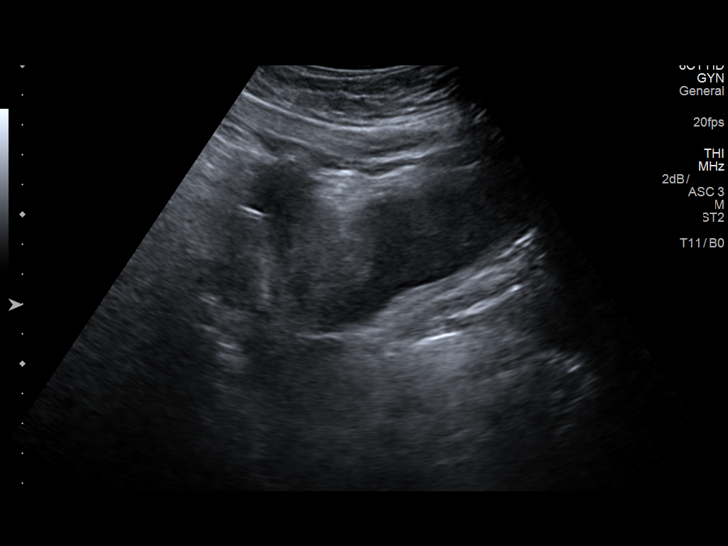
[im 6/70]
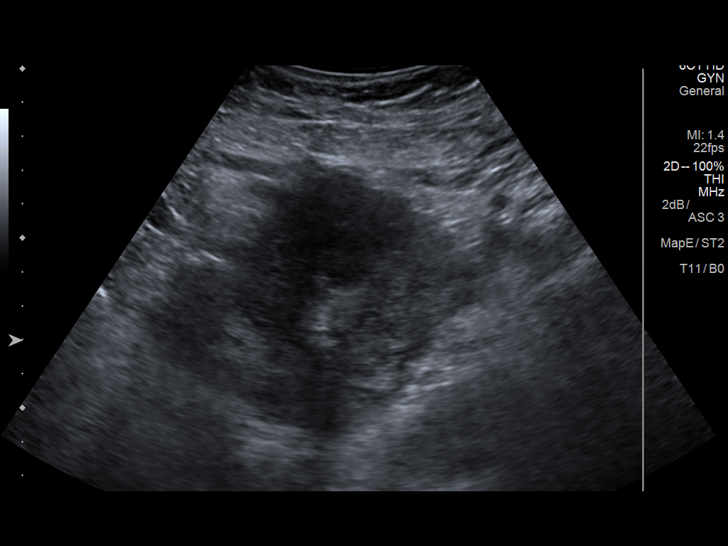
[im 12/70]
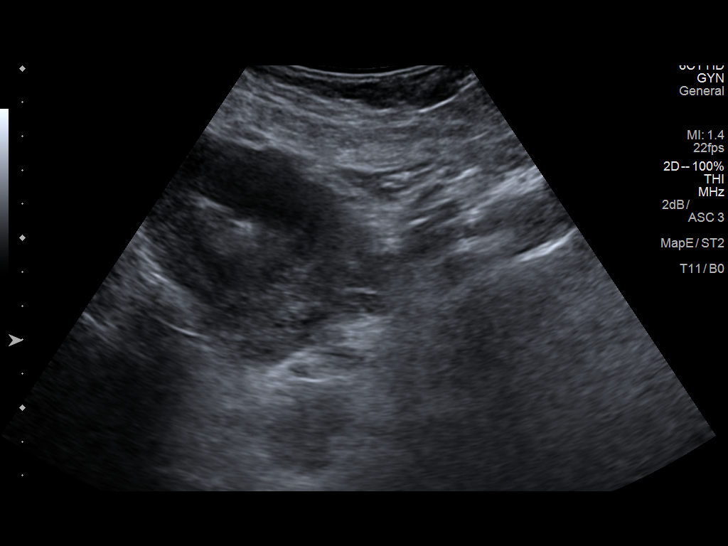
[im 18/70]
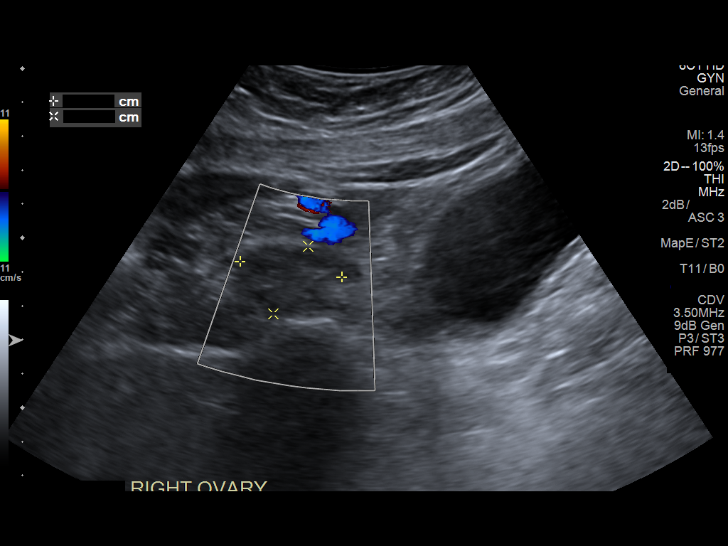
[im 24/70]
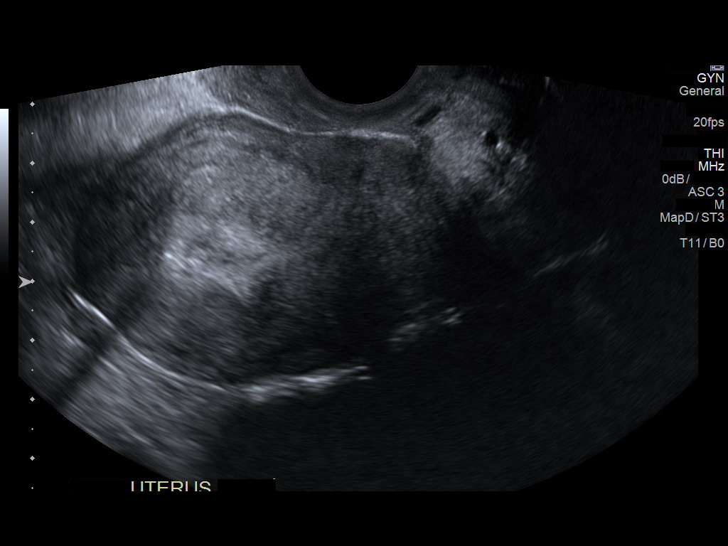
[im 29/70]
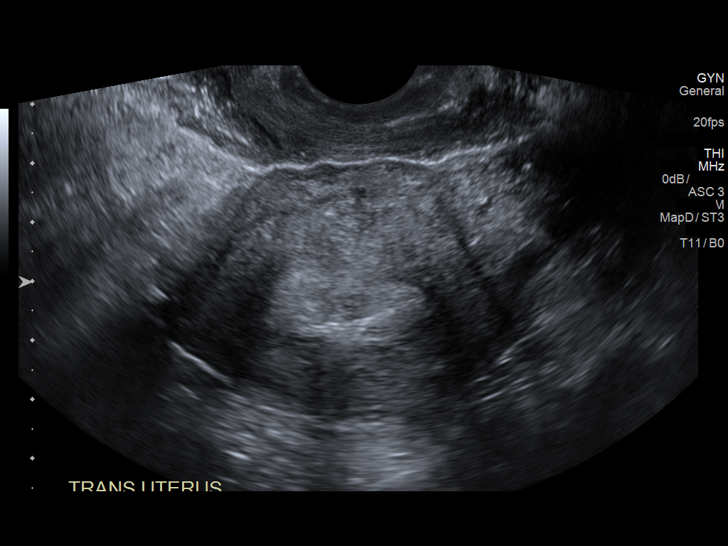
[im 35/70]
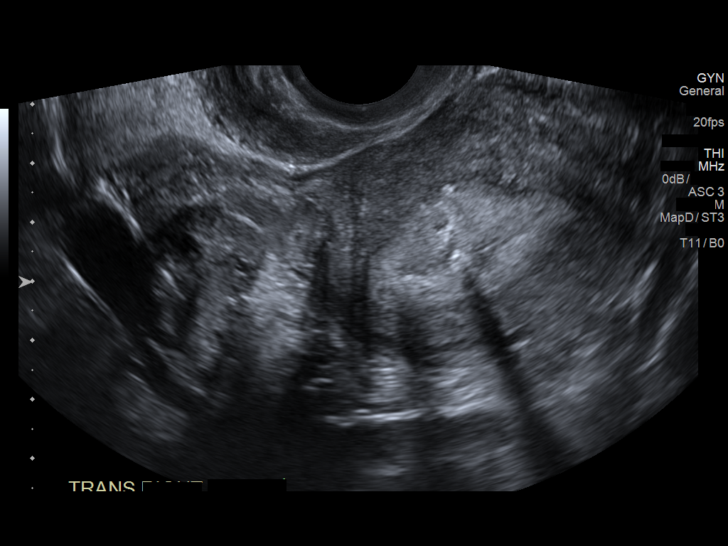
[im 41/70]
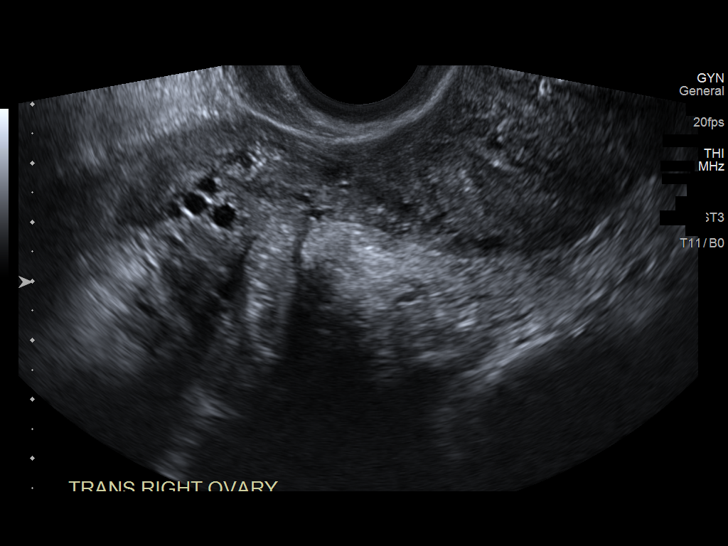
[im 47/70]
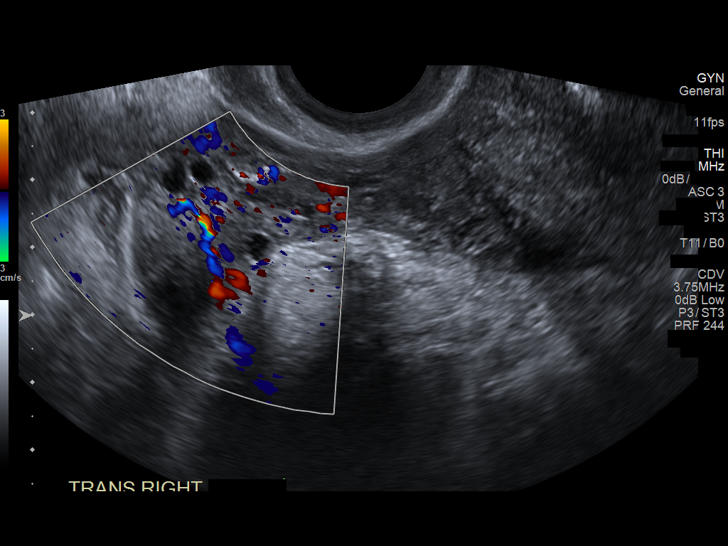
[im 52/70]
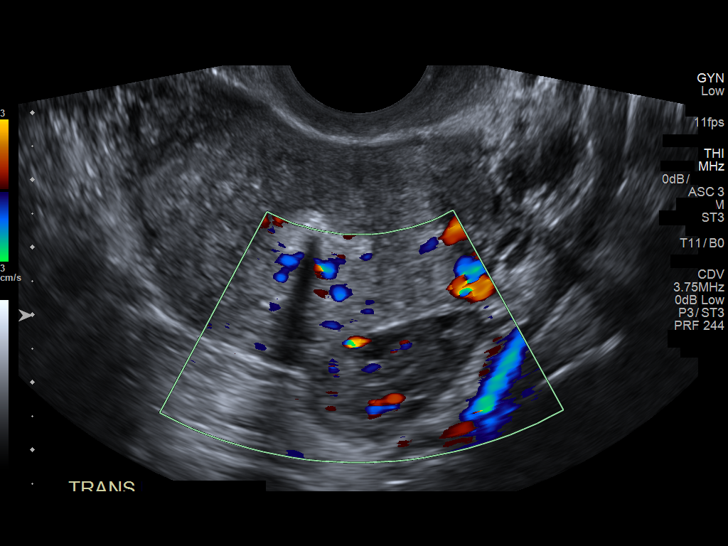
[im 58/70]
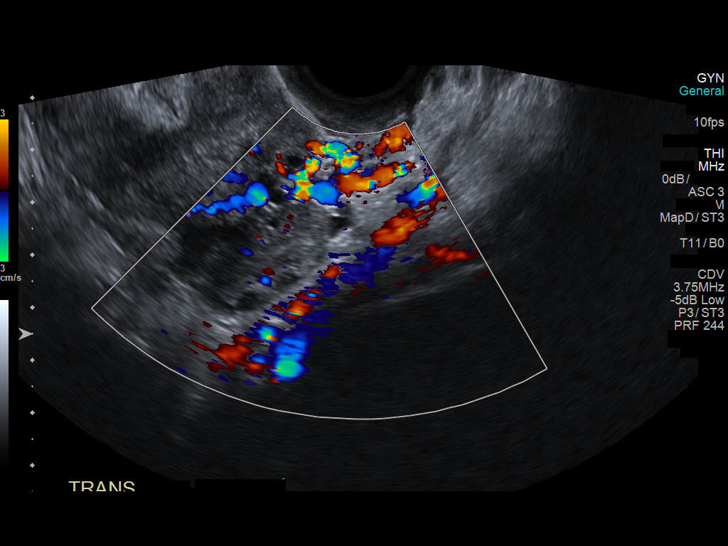
[im 64/70]
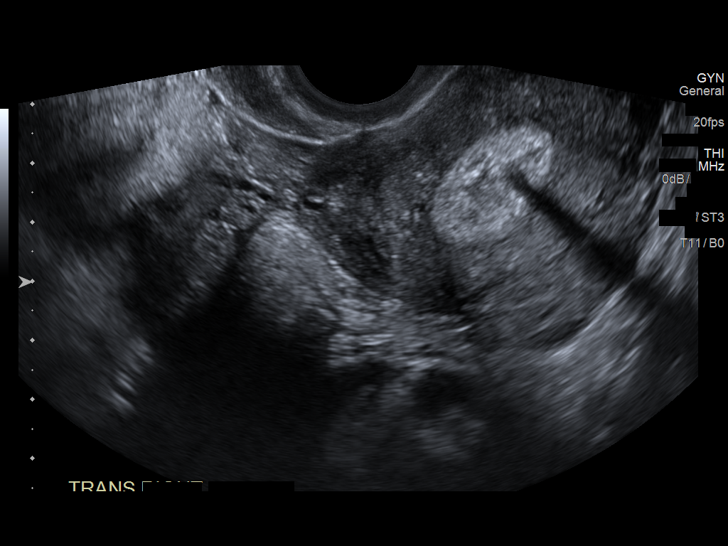
[im 70/70]
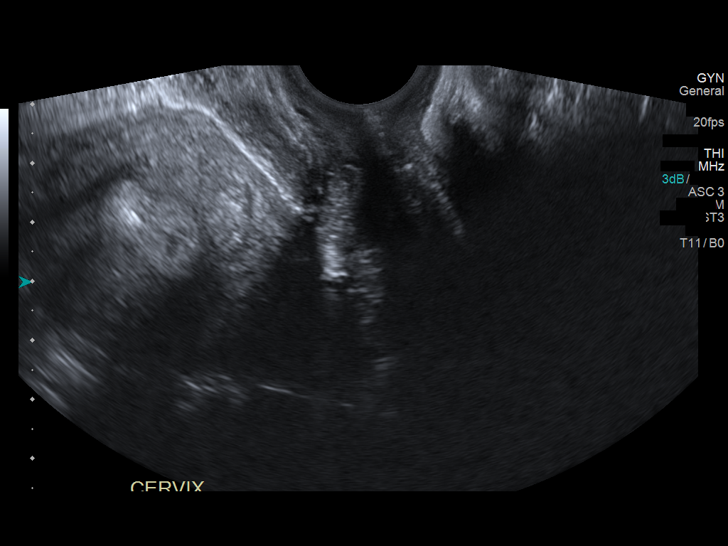

[13 of 25 positions shown; findings below may reference images not displayed]

FINDINGS: Uterus

Measurements: 8.0 x 4.5 x 5.6 cm. No fibroids or other mass
visualized.

Endometrium

Thickness: 18 mm. No focal abnormality visualized. IUD in place
within the endometrial cavity.

Right ovary

Measurements: 2.9 x 1.7 x 2.8 cm. Normal appearance/no adnexal mass.

Left ovary

Measurements: 3.0 x 1.6 x 2.8 cm. Normal appearance/no adnexal mass.

Other findings

Trace free physiologic fluid within the pelvis. Prominent
vasculature seen within the left adnexa.
IMPRESSION: 1. No acute abnormality identified within the pelvis.
2. Endometrial thickness is considered abnormal measuring up to 18
mm. Consider follow-up by US in 6-8 weeks, during the week
immediately following menses (exam timing is critical).
3. IUD in place within the endometrial cavity.
4. Prominent vasculature within the left adnexa, which may reflect
sequelae of underlying pelvic congestion syndrome.

## 2019-01-23 ENCOUNTER — Other Ambulatory Visit: Payer: Self-pay

## 2019-01-27 ENCOUNTER — Ambulatory Visit (INDEPENDENT_AMBULATORY_CARE_PROVIDER_SITE_OTHER): Payer: Self-pay | Admitting: Obstetrics & Gynecology

## 2019-01-27 ENCOUNTER — Encounter: Payer: Self-pay | Admitting: Obstetrics & Gynecology

## 2019-01-27 ENCOUNTER — Other Ambulatory Visit: Payer: Self-pay

## 2019-01-27 VITALS — BP 120/82 | Ht <= 58 in | Wt 145.0 lb

## 2019-01-27 DIAGNOSIS — N760 Acute vaginitis: Secondary | ICD-10-CM

## 2019-01-27 DIAGNOSIS — N898 Other specified noninflammatory disorders of vagina: Secondary | ICD-10-CM

## 2019-01-27 DIAGNOSIS — Z30431 Encounter for routine checking of intrauterine contraceptive device: Secondary | ICD-10-CM

## 2019-01-27 DIAGNOSIS — B9689 Other specified bacterial agents as the cause of diseases classified elsewhere: Secondary | ICD-10-CM

## 2019-01-27 DIAGNOSIS — Z113 Encounter for screening for infections with a predominantly sexual mode of transmission: Secondary | ICD-10-CM

## 2019-01-27 LAB — WET PREP FOR TRICH, YEAST, CLUE

## 2019-01-27 MED ORDER — FLUCONAZOLE 150 MG PO TABS: 150.0000 mg | ORAL_TABLET | Freq: Every day | ORAL | 2 refills | Status: AC

## 2019-01-27 MED ORDER — TINIDAZOLE 500 MG PO TABS: 2.0000 g | ORAL_TABLET | Freq: Every day | ORAL | 2 refills | Status: AC

## 2019-01-27 NOTE — Progress Notes (Signed)
    Haley Velasquez [place] 1991/01/10 [id number]        28 y.o.  [id number]  Single.  Stable boyfriend x 5 years.    RP: Recurrent vaginal infections with odor and itching  HPI: Recurrent vaginal infections with odor and itching after IC for the last 5 yrs when started dating her boyfriend.  Treats with Monistat with improves symptoms, but recur after IC each time.  Paragard IUD x 5 years.  Positional intermittent pelvic discomfort.  No pain with IC.  Urine/BMs normal.  No Fever.  Pap test normal per patient in 2020.   OB History  Gravida Para Term Preterm AB Living  7 6 6   1 6   SAB TAB Ectopic Multiple Live Births  1       6    # Outcome Date GA Lbr Len/2nd Weight Sex Delivery Anes PTL Lv  7 Term 11/22/12 [redacted]w[redacted]d 04:02 / 00:11 7 lb 3.9 oz (3.285 kg) F Vag-Spont None  LIV  6 Term 08/01/11    F Vag-Spont None  LIV  5 Term 02/15/09    M Vag-Spont None  LIV  4 Term 08/31/07    M Vag-Spont None  LIV  3 Term 12/03/05    M Vag-Spont None  LIV  2 Term 08/01/04    F Vag-Spont None  LIV  1 SAB             Past medical history,surgical history, problem list, medications, allergies, family history and social history were all reviewed and documented in the EPIC chart.   Directed ROS with pertinent positives and negatives documented in the history of present illness/assessment and plan.  Exam:  Vitals:   01/27/19 0942  BP: 120/82  Weight: 145 lb (65.8 kg)  Height: 4' 9.5" (1.461 m)   General appearance:  Normal  Abdomen: Normal  Gynecologic exam: Vulva normal.  Speculum:  Cervix/Vagina normal.  IUD strings visible at exo-cervix. Wet prep and Gono-Chlam done  Wet prep:  Clue cells present   Assessment/Plan:  28 y.o. [id number]   1. Vaginal odor Wet prep confirming a bacterial vaginosis.  2. Vaginal itching No yeast vaginitis on wet prep today, but probably treated successfully with Monistat.  3. Bacterial vaginosis Confirmed bacterial vaginosis.  Patient's as recurrences with  intercourse.  Will treat with tinidazole.  No contraindication and usage reviewed with patient.  Will then also treat with fluconazole for possible recurrence of yeast after antibiotics.  Usage reviewed and prescription sent to pharmacy.  Recommend probiotic tablets vaginally weekly for prevention.  4. Screen for STD (sexually transmitted disease) No evidence of PID.  Gynecologic exam revealing a normal uterus and ovaries.  Rule out gonorrhea and chlamydia.  Gonorrhea and Chlamydia pending.  5. Encounter for routine checking of intrauterine contraceptive device (IUD) Well on ParaGard IUD for 5 years.  ParaGard IUD in good position.  Other orders - tinidazole (TINDAMAX) 500 MG tablet; Take 4 tablets (2,000 mg total) by mouth daily for 2 days. - fluconazole (DIFLUCAN) 150 MG tablet; Take 1 tablet (150 mg total) by mouth daily for 3 days.  Counseling on above issues and coordination of care more than 50% for 30 minutes.  Genia Del MD, 9:51 AM 01/27/2019

## 2019-01-27 NOTE — Patient Instructions (Signed)
1. Vaginal odor Wet prep confirming a bacterial vaginosis.  2. Vaginal itching No yeast vaginitis on wet prep today, but probably treated successfully with Monistat.  3. Bacterial vaginosis Confirmed bacterial vaginosis.  Patient's as recurrences with intercourse.  Will treat with tinidazole.  No contraindication and usage reviewed with patient.  Will then also treat with fluconazole for possible recurrence of yeast after antibiotics.  Usage reviewed and prescription sent to pharmacy.  Recommend probiotic tablets vaginally weekly for prevention.  4. Screen for STD (sexually transmitted disease) No evidence of PID.  Gynecologic exam revealing a normal uterus and ovaries.  Rule out gonorrhea and chlamydia.  Gonorrhea and Chlamydia pending.  5. Encounter for routine checking of intrauterine contraceptive device (IUD) Well on ParaGard IUD for 5 years.  ParaGard IUD in good position.  Other orders - tinidazole (TINDAMAX) 500 MG tablet; Take 4 tablets (2,000 mg total) by mouth daily for 2 days. - fluconazole (DIFLUCAN) 150 MG tablet; Take 1 tablet (150 mg total) by mouth daily for 3 days.  Cleotis Nipper, fue un placer verle hoy!  Voy a informarle de sus [employer].

## 2019-01-27 NOTE — Addendum Note (Signed)
Addended by: Berna Spare A on: 01/27/2019 11:52 AM   Modules accepted: Orders

## 2019-01-27 NOTE — Addendum Note (Signed)
Addended by: Berna Spare A on: 01/27/2019 12:59 PM   Modules accepted: Orders

## 2019-01-28 LAB — C. TRACHOMATIS/N. GONORRHOEAE RNA
C. trachomatis RNA, TMA: NOT DETECTED
N. gonorrhoeae RNA, TMA: NOT DETECTED

## 2019-04-17 ENCOUNTER — Other Ambulatory Visit: Payer: Self-pay

## 2019-04-17 ENCOUNTER — Emergency Department (HOSPITAL_COMMUNITY)
Admission: EM | Admit: 2019-04-17 | Discharge: 2019-04-17 | Disposition: A | Discharge status: Home / Self Care | Payer: Self-pay | Attending: Emergency Medicine | Admitting: Emergency Medicine

## 2019-04-17 ENCOUNTER — Encounter (HOSPITAL_COMMUNITY): Payer: Self-pay | Admitting: Emergency Medicine

## 2019-04-17 DIAGNOSIS — R195 Other fecal abnormalities: Secondary | ICD-10-CM | POA: Insufficient documentation

## 2019-04-17 DIAGNOSIS — R05 Cough: Secondary | ICD-10-CM | POA: Insufficient documentation

## 2019-04-17 DIAGNOSIS — U071 COVID-19: Secondary | ICD-10-CM | POA: Insufficient documentation

## 2019-04-17 DIAGNOSIS — M7918 Myalgia, other site: Secondary | ICD-10-CM | POA: Insufficient documentation

## 2019-04-17 DIAGNOSIS — R112 Nausea with vomiting, unspecified: Secondary | ICD-10-CM | POA: Insufficient documentation

## 2019-04-17 LAB — I-STAT BETA HCG BLOOD, ED (MC, WL, AP ONLY): I-stat hCG, quantitative: <5 m[IU]/mL (ref <5)

## 2019-04-17 LAB — COMPREHENSIVE METABOLIC PANEL
ALT: 162 U/L — ABNORMAL HIGH (ref 0–44)
AST: 123 U/L — ABNORMAL HIGH (ref 15–41)
Albumin: 3.8 g/dL (ref 3.5–5.0)
Alkaline Phosphatase: 75 U/L (ref 38–126)
Anion gap: 11 (ref 5–15)
BUN: 8 mg/dL (ref 6–20)
CO2: 22 mmol/L (ref 22–32)
Calcium: 8.9 mg/dL (ref 8.9–10.3)
Chloride: 107 mmol/L (ref 98–111)
Creatinine, Ser: 0.51 mg/dL (ref 0.44–1.00)
GFR calc Af Amer: >60 mL/min (ref >60)
GFR calc non Af Amer: >60 mL/min (ref >60)
Glucose, Bld: 97 mg/dL (ref 70–99)
Potassium: 3.9 mmol/L (ref 3.5–5.1)
Sodium: 140 mmol/L (ref 135–145)
Total Bilirubin: 0.7 mg/dL (ref 0.3–1.2)
Total Protein: 8 g/dL (ref 6.5–8.1)

## 2019-04-17 LAB — CBC WITH DIFFERENTIAL/PLATELET
Abs Immature Granulocytes: 0.03 10*3/uL (ref 0.00–0.07)
Basophils Absolute: 0 10*3/uL (ref 0.0–0.1)
Basophils Relative: 1 %
Eosinophils Absolute: 0.1 10*3/uL (ref 0.0–0.5)
Eosinophils Relative: 2 %
HCT: 41.6 % (ref 36.0–46.0)
Hemoglobin: 13.6 g/dL (ref 12.0–15.0)
Immature Granulocytes: 1 %
Lymphocytes Relative: 36 %
Lymphs Abs: 2.2 10*3/uL (ref 0.7–4.0)
MCH: 28.3 pg (ref 26.0–34.0)
MCHC: 32.7 g/dL (ref 30.0–36.0)
MCV: 86.7 fL (ref 80.0–100.0)
Monocytes Absolute: 0.4 10*3/uL (ref 0.1–1.0)
Monocytes Relative: 6 %
Neutro Abs: 3.3 10*3/uL (ref 1.7–7.7)
Neutrophils Relative %: 54 %
Platelets: 384 10*3/uL (ref 150–400)
RBC: 4.8 MIL/uL (ref 3.87–5.11)
RDW: 13.1 % (ref 11.5–15.5)
WBC: 6 10*3/uL (ref 4.0–10.5)
nRBC: 0 % (ref 0.0–0.2)

## 2019-04-17 MED ORDER — LACTATED RINGERS IV BOLUS
1000.0000 mL | Freq: Once | INTRAVENOUS | Status: AC
  Administered 2019-04-17: 09:00:00 1000 mL via INTRAVENOUS

## 2019-04-17 MED ORDER — ONDANSETRON 4 MG PO TBDP: 4.0000 mg | ORAL_TABLET | Freq: Three times a day (TID) | ORAL | 0 refills | Status: DC | PRN

## 2019-04-17 MED ORDER — METOCLOPRAMIDE HCL 5 MG/ML IJ SOLN
10.0000 mg | Freq: Once | INTRAMUSCULAR | Status: AC
  Administered 2019-04-17: 13:00:00 10 mg via INTRAVENOUS
  Filled 2019-04-17: qty 2

## 2019-04-17 MED ORDER — ONDANSETRON 4 MG PO TBDP
4.0000 mg | ORAL_TABLET | Freq: Once | ORAL | Status: AC
  Administered 2019-04-17: 09:00:00 4 mg via ORAL
  Filled 2019-04-17: qty 1

## 2019-04-17 MED ORDER — DIPHENHYDRAMINE HCL 50 MG/ML IJ SOLN: 12.5000 mg | Freq: Once | INTRAMUSCULAR | Status: DC

## 2019-04-17 MED ORDER — DIPHENHYDRAMINE HCL 25 MG PO CAPS
25.0000 mg | ORAL_CAPSULE | Freq: Once | ORAL | Status: AC
  Administered 2019-04-17: 25 mg via ORAL
  Filled 2019-04-17: qty 1

## 2019-04-17 NOTE — ED Provider Notes (Signed)
[facility]   CSN: [id number] Arrival date & time: 04/17/19  0827     History   Chief Complaint Chief Complaint  Patient presents with  .  Covid Positive  . Nausea  . Diarrhea  . Generalized Body Aches  . Fever    HPI Haley Velasquez is a 28 y.o. female.     HPI  Patient is a 28 year old female with no significant past medical history presenting for nausea, vomiting, diarrhea, and myalgias.  Patient reports that her symptoms all began 1.5 weeks ago initially with 2 days of fever, headache, and myalgias.  She was tested at an outpatient testing site for COVID-19 and had a positive test 3 days ago.  She then developed cough and shortness of breath over the past 3 days has had vomiting anytime she tried to eat something and loose stools every time she eats something.  She denies any fever or chills currently.  She denies bilious or bloody vomiting.  Denies melena or hematochezia.  Patient denies taking medications for her pain.  No history of immune compromise status or respiratory conditions.  [employer] interpreter used for this encounter.  Past Medical History:  Diagnosis Date  . No pertinent past medical history     There are no active problems to display for this patient.   Past Surgical History:  Procedure Laterality Date  . NO PAST SURGERIES       OB History    Gravida  7   Para  6   Term  6   Preterm      AB  1   Living  6     SAB  1   TAB      Ectopic      Multiple      Live Births  6            Home Medications    Prior to Admission medications   Medication Sig Start Date End Date Taking? Authorizing Provider  cetirizine (ZYRTEC) 10 MG tablet Take 10 mg by mouth daily.    [provider]    Family History Family History  Problem Relation Age of Onset  . Diabetes Mother   . Diabetes Father     Social History Social History   Tobacco Use  . Smoking  status: Never Smoker  . Smokeless tobacco: Never Used  Substance Use Topics  . Alcohol use: No  . Drug use: No     Allergies   Patient has no known allergies.   Review of Systems Review of Systems  Constitutional: Negative for chills and fever.  HENT: Negative for congestion, rhinorrhea, sinus pain and sore throat.   Eyes: Negative for visual disturbance.  Respiratory: Positive for cough. Negative for chest tightness and shortness of breath.   Cardiovascular: Negative for chest pain, palpitations and leg swelling.  Gastrointestinal: Positive for diarrhea, nausea and vomiting. Negative for abdominal pain.  Genitourinary: Negative for dysuria and flank pain.  Musculoskeletal: Negative for back pain and myalgias.  Skin: Negative for rash.  Neurological: Negative for dizziness, syncope and headaches.     Physical Exam Updated Vital Signs BP 107/65 (BP Location: Left Arm)   Pulse 73   Temp 98.8 F (37.1 C) (Oral)   Resp 14   SpO2 98%   Physical Exam Vitals signs and nursing note reviewed.  Constitutional:      General: She is not in acute distress.  Appearance: She is well-developed.  HENT:     Head: Normocephalic and atraumatic.     Right Ear: Tympanic membrane normal.     Left Ear: Tympanic membrane normal.     Nose: No congestion or rhinorrhea.  Eyes:     Conjunctiva/sclera: Conjunctivae normal.     Pupils: Pupils are equal, round, and reactive to light.  Neck:     Musculoskeletal: Normal range of motion and neck supple.  Cardiovascular:     Rate and Rhythm: Normal rate and regular rhythm.     Heart sounds: S1 normal and S2 normal. No murmur.  Pulmonary:     Effort: Pulmonary effort is normal.     Breath sounds: Normal breath sounds. No wheezing or rales.  Abdominal:     General: There is no distension.     Palpations: Abdomen is soft.     Tenderness: There is no abdominal tenderness. There is no guarding.  Musculoskeletal: Normal range of motion.         General: No deformity.  Lymphadenopathy:     Cervical: No cervical adenopathy.  Skin:    General: Skin is warm and dry.     Findings: No erythema or rash.  Neurological:     Mental Status: She is alert.     Comments: Cranial nerves grossly intact. Patient moves extremities symmetrically and with good coordination.  Psychiatric:        Behavior: Behavior normal.        Thought Content: Thought content normal.        Judgment: Judgment normal.      ED Treatments / Results  Labs (all labs ordered are listed, but only abnormal results are displayed) Labs Reviewed  COMPREHENSIVE METABOLIC PANEL - Abnormal; Notable for the following components:      Result Value   AST 123 (*)    ALT 162 (*)    All other components within normal limits  CBC WITH DIFFERENTIAL/PLATELET  I-STAT BETA HCG BLOOD, ED ([facility], [facility], [facility] ONLY)    EKG    Radiology No results found.  Procedures Procedures (including critical care time)  Medications Ordered in ED Medications  lactated ringers bolus 1,000 mL (1,000 mLs Intravenous New Bag/Given 04/17/19 0914)  ondansetron (ZOFRAN-ODT) disintegrating tablet 4 mg (4 mg Oral Given 04/17/19 0914)     Initial Impression / Assessment and Plan / ED Course  I have reviewed the triage vital signs and the nursing notes.  Pertinent labs & imaging results that were available during my care of the patient were reviewed by me and considered in my medical decision making (see chart for details).  Clinical Course as of Apr 16 1437  Thu Apr 17, 2019  1109 AST(!): 123 [AM]  1109 C/w COVID-19 infection. Will have pt recheck as an OP.  ALT(!): 162 [AM]  1109 Negative.   I-Stat beta hCG blood, ED [AM]  1219 Pt reports feeling that she has to vomit when she eats. Will order more antiemetics.    [AM]    Clinical Course User Index [AM] Albesa Seen, PA-C       This is a well-appearing 28 year old female with a known COVID-19 infection presenting for nausea,  vomiting, loose stools.  She is nontoxic, afebrile and hemodynamically stable.  From respiratory standpoint she appears to be improving.  We will check basic lab work, hydrate patient with fluids, give antiemetics and p.o. challenge.  If patient is able to pass p.o. challenge with antiemetics, she may be  followed as an outpatient.  Transaminases noted to be elevated today.  This is likely secondary to COVID-19, however do recommend that patient follows up with primary care for recheck in about a month.  She is given return precautions for any intractable nausea or vomiting or worsening symptoms.  Patient is in understanding and agrees with plan of care.  Final Clinical Impressions(s) / ED Diagnoses   Final diagnoses:  Non-intractable vomiting with nausea, unspecified vomiting type  Loose stools  COVID-19    ED Discharge Orders         Ordered    ondansetron (ZOFRAN ODT) 4 MG disintegrating tablet  Every 8 hours PRN     04/17/19 1352           Elisha PonderMurray, Jasmyn Picha B, PA-C 04/17/19 1546    Melene PlanFloyd, Dan, DO 04/17/19 1550

## 2019-04-17 NOTE — ED Triage Notes (Signed)
Pt states since Monday she has been coughing,vomiting, episodes of diarrhea, generalized body aches, and fever.

## 2019-04-17 NOTE — ED Notes (Signed)
Pt states she had a positive Covid test performed at a [facility].

## 2019-04-17 NOTE — Discharge Instructions (Signed)
°  Tome zofran cada 8 horas segn sea necesario.  Regrese a la sala de emergencias si tiene nuseas o vmitos que empeoran.   Please take zofran every 8 hours as needed.   Please come back to the emergency room if you have any worsening nausea or vomiting.

## 2019-04-17 NOTE — ED Notes (Signed)
Pt states "feels better and stomach doesn't feel upset"

## 2019-04-17 NOTE — ED Notes (Signed)
Pt stated "while drinking water, the water makes the pt feels the urge to vomit"

## 2019-04-29 ENCOUNTER — Encounter: Payer: Self-pay | Admitting: Family Medicine

## 2019-04-29 ENCOUNTER — Ambulatory Visit (INDEPENDENT_AMBULATORY_CARE_PROVIDER_SITE_OTHER): Payer: Self-pay | Admitting: Family Medicine

## 2019-04-29 ENCOUNTER — Other Ambulatory Visit: Payer: Self-pay

## 2019-04-29 DIAGNOSIS — U071 COVID-19: Secondary | ICD-10-CM

## 2019-04-29 DIAGNOSIS — J028 Acute pharyngitis due to other specified organisms: Secondary | ICD-10-CM

## 2019-04-29 MED ORDER — LIDOCAINE VISCOUS HCL 2 % MT SOLN: 15.0000 mL | OROMUCOSAL | 1 refills | Status: DC | PRN

## 2019-04-29 NOTE — Progress Notes (Signed)
Virtual Visit via Telephone Note  I connected with Haley Velasquez on 04/29/19 at 11:10 AM EDT by telephone and verified that I am speaking with the correct person using two identifiers.  Location: Patient: Located at home during today's encounter  Provider: Located at primary care office    Interpreter service ([place])  I discussed the limitations, risks, security and privacy concerns of performing an evaluation and management service by telephone and the availability of in person appointments. I also discussed with the patient that there may be a patient responsible charge related to this service. The patient expressed understanding and agreed to proceed.  History of Present Illness: Haley Velasquez is present via today's visit. Telemedicine encounter to establish care and for COVID-19 follow-up.  Patient had presented to the ER on 04/17/19 with COVID-19 symptoms such as diarrhea, myalgias, vomiting, cough and shortness of breath.  Patient had tested positive at community outpatient testing site 3 days prior to presenting to the ER.  She was negative for any concerning symptoms and was discharged home to self quarantine.  She is uncertain if she has had a fever recently as she does not have a thermometer to check her temperature.  At present she is complaining of severe throat pain as if something stuck in her throat.  Her GI symptoms have resolved.  She is also no longer coughing.  She is questioning whether she is safe to go out into the community with a mask.  No one in her household has been tested positive for shown symptoms of COVID-19.   Assessment and Plan: 1. COVID-19 virus infection Patient advised that he should consider himself infected until he is symptom free for 72 hours Encouraged hydration with water. Avoid social interaction as you are still symptomatic.  Advised to go immediately to the ER if symptoms worsen or do not improve.  2. Pharyngitis due to  other organism Trial Lidocaine viscous for throat pain relief  Use a saltwater gargle-  to  teaspoon of salt dissolved in a 4-ounce to 8-ounce glass of warm water.  Gargle the solution for approximately 15-30 seconds and then spit.  It is important not to swallow the solution.  You can also use throat lozenges/cough drops and Chloraseptic spray to help with throat pain or discomfort.  Warm or cold liquids can also be helpful in relieving throat pain.  Follow Up Instructions: Follow-up in 6 weeks for CPE   I discussed the assessment and treatment plan with the patient. The patient was provided an opportunity to ask questions and all were answered. The patient agreed with the plan and demonstrated an understanding of the instructions.   The patient was advised to call back or seek an in-person evaluation if the symptoms worsen or if the condition fails to improve as anticipated.  I provided 30 minutes of non-face-to-face time during this encounter.   Haley Barrows, FNP

## 2019-04-29 NOTE — Progress Notes (Deleted)
Worked up patient for their telephone visit with provider Molli Barrows, FNP-C using [facility] [employer](Sebastian [phone]). Verified date of birth. States that the fever, cough, nausea, vomiting, diarrhea & body aches has resolved. She is still having a sore throat. Feels like something is stuck in her throat. KWalker, CMA.

## 2019-06-09 ENCOUNTER — Telehealth: Payer: Self-pay

## 2019-06-09 NOTE — Telephone Encounter (Signed)
Called patient to do their pre-visit COVID screening using [employer](Maria [phone]).  Call went to voicemail. Unable to do prescreening.

## 2019-06-10 ENCOUNTER — Ambulatory Visit (INDEPENDENT_AMBULATORY_CARE_PROVIDER_SITE_OTHER): Payer: Self-pay | Admitting: Internal Medicine

## 2019-06-10 ENCOUNTER — Other Ambulatory Visit (HOSPITAL_COMMUNITY)
Admission: RE | Admit: 2019-06-10 | Discharge: 2019-06-10 | Disposition: A | Discharge status: Home / Self Care | Payer: Self-pay | Source: Ambulatory Visit | Attending: Internal Medicine | Admitting: Internal Medicine

## 2019-06-10 ENCOUNTER — Other Ambulatory Visit: Payer: Self-pay

## 2019-06-10 ENCOUNTER — Encounter: Payer: Self-pay | Admitting: Internal Medicine

## 2019-06-10 VITALS — BP 106/68 | HR 52 | Temp 97.3°F | Resp 17 | Ht <= 58 in | Wt 144.6 lb

## 2019-06-10 DIAGNOSIS — Z124 Encounter for screening for malignant neoplasm of cervix: Secondary | ICD-10-CM | POA: Insufficient documentation

## 2019-06-10 DIAGNOSIS — Z01411 Encounter for gynecological examination (general) (routine) with abnormal findings: Secondary | ICD-10-CM

## 2019-06-10 DIAGNOSIS — R945 Abnormal results of liver function studies: Secondary | ICD-10-CM

## 2019-06-10 DIAGNOSIS — R7989 Other specified abnormal findings of blood chemistry: Secondary | ICD-10-CM

## 2019-06-10 DIAGNOSIS — Z6830 Body mass index (BMI) 30.0-30.9, adult: Secondary | ICD-10-CM

## 2019-06-10 DIAGNOSIS — Z113 Encounter for screening for infections with a predominantly sexual mode of transmission: Secondary | ICD-10-CM

## 2019-06-10 DIAGNOSIS — Z01419 Encounter for gynecological examination (general) (routine) without abnormal findings: Secondary | ICD-10-CM

## 2019-06-10 NOTE — Patient Instructions (Addendum)
Cuidados preventivos en las mujeres de 21 a 39 aos de edad Preventive Care 21-28 Years Old, Female Los cuidados preventivos hacen referencia a las opciones en cuanto a las visitas al mdico y al estilo de vida, las cuales pueden promover la salud y el bienestar. Esto puede comprender lo siguiente:  Un examen fsico anual. Esto tambin se puede llamar control de bienestar anual.  Visitas regulares al dentista y exmenes oculares.  Vacunas.  Estudios para detectar ciertas enfermedades.  Opciones saludables de estilo de vida, como seguir una dieta saludable, hacer ejercicio regularmente, no usar drogas ni productos que contengan nicotina y tabaco, y limitar el consumo de alcohol. Qu puedo esperar para mi visita de cuidado preventivo? Examen fsico El mdico revisar lo siguiente:  Estatura y peso. Esto se puede usar para calcular el ndice de masa corporal (IMC), que indica si tiene un peso saludable.  Frecuencia cardaca y presin arterial.  Piel para detectar manchas anormales. Asesoramiento Su mdico puede preguntarle acerca de:  Consumo de tabaco, alcohol y drogas.  Su bienestar emocional.  El bienestar en el hogar y sus relaciones personales.  Su actividad sexual.  Sus hbitos de alimentacin.  Su trabajo y ambiente laboral.  Mtodos anticonceptivos.  Su ciclo menstrual.  Sus antecedentes de embarazo. Qu vacunas necesito?  Vacuna antigripal  Se recomienda aplicarse esta vacuna todos los aos. Vacuna contra el ttanos, difteria y tos ferina (Tdap)  Es posible que tenga que aplicarse un refuerzo contra el ttanos y la difteria (DT) cada 10aos. Vacuna contra la varicela  Es posible que tenga que aplicrsela si no recibi esta vacuna. Vacuna contra el virus del papiloma humano (VPH)  Si el mdico se lo recomienda, puede necesitar tres dosis a lo largo de 6 meses. Vacuna contra el sarampin, rubola y paperas (SRP)  Tendr que aplicarse por lo menos una  dosis de la SRP. Podra tambin necesitar una segunda dosis. Vacuna antimeningoccica conjugada (MenACWY)  Se recomienda la aplicacin de una dosis si tiene entre 19 y 21aos, y es estudiante universitario de primer ao que vive en una residencia estudiantil, o si tiene una de varias enfermedades. Podra tambin necesitar dosis de refuerzo. Vacuna antineumoccica conjugada (PCV13)  Puede necesitar esta vacuna si tiene determinadas enfermedades y no se vacun anteriormente. Vacuna antineumoccica de polisacridos (PPSV23)  Quizs tenga que aplicarse una o dos dosis si fuma o si tiene determinadas afecciones. Vacuna contra la hepatitis A  Es posible que necesite esta vacuna si tiene ciertas afecciones o si viaja o trabaja en lugares en los que podra estar expuesta a la hepatitis A. Vacuna contra la hepatitis B  Es posible que necesite esta vacuna si tiene ciertas afecciones o si viaja o trabaja en lugares en los que podra estar expuesta a la hepatitis B. Vacuna antihaemophilus influenzae tipo B (Hib)  Puede necesitar esta vacuna si tiene determinadas afecciones. Puede recibir las vacunas en forma de dosis individuales o en forma de dos o ms vacunas juntas en la misma inyeccin (vacunas combinadas). Hable con su mdico sobre los riesgos y beneficios de las vacunas combinadas. Qu pruebas necesito?  Anlisis de sangre  Niveles de lpidos y colesterol. Estos se pueden verificar cada 5 aos, a partir de los 20 aos de edad.  Anlisis de hepatitisC.  Anlisis de hepatitisB. Pruebas de deteccin  Pruebas de deteccin de la diabetes. Esto se realiza mediante un control del azcar en la sangre (glucosa) despus de no haber comido durante un periodo de tiempo (ayuno).    Anlisis de enfermedades de transmisin sexual (ETS).  Pruebas de deteccin de cncer relacionado con las mutaciones del BRCA. Es posible que se las deba realizar si tiene antecedentes de cncer de mama, de ovario, de  trompas o peritoneal.  Examen plvico y prueba de Papanicolaou. Esto se puede realizar cada 76aos a Renato Gails de los 21aos de edad. A partir de los 30 aos, esto se puede Optometrist cada 5 aos si usted se realiza una prueba de Papanicolaou en combinacin con una prueba de deteccin del virus del papiloma humano (VPH). Hable con su mdico [employer], las opciones de tratamiento y, si corresponde, la necesidad de Optometrist ms pruebas. Siga estas instrucciones en su casa: Comida y bebida   Siga una dieta que incluya frutas frescas y verduras, cereales integrales, lcteos descremados y protenas magras.  Tome los suplementos vitamnicos y [employer] se lo haya indicado el mdico.  No beba alcohol si: ? Su mdico le indica no hacerlo. ? Est embarazada, puede estar embarazada o est tratando de quedar embarazada.  Si bebe alcohol: ? Limite la cantidad que consume de 0 a 1 medida por da. ? Est atenta a la cantidad de alcohol que hay en las bebidas que toma. En los [place], una medida equivale a una botella de cerveza de 12oz (366m), un vaso de vino de 5oz (1472m o un vaso de una bebida alcohlica de alta graduacin de 1oz (4451m Estilo de vidNavistar International Corporationlas encas a diario.  Mantngase activa. Haga al menos 34m81mos de ejercicio 5o ms das Hilton Hotelso consuma ningn producto que contenga nicotina o tabaco, como cigarrillos, cigarrillos electrnicos y tabaco de mascHigher education careers adviser necesita ayuda para dejar de fumar, consulte al mdico.  Si es sexualmente activa, practique sexo seguro. Use un condn u otra forma de mtodo anticonceptivo (anticonceptivos) a fin de evitEnvironmental health practitionerTS (infecciones de transmisin sexual). Si busca un embarazo, realice una consulta previa a la concepcin con el mdico. Cundo volver?  Visite al mdico una vez al ao para una visita de control.  Pregntele al mdico con qu frecuencia debe  realizarse un control de la vista y los dientes.  Mantenga su esquema de vacunacin al da. Esta informacin no tiene comoMarine scientistconsejo del mdico. Asegrese de hacerle al mdico cualquier pregunta que tenga. Document Released: 07/19/2005 Document Revised: 07/25/2018 Document Reviewed: 07/25/2018 Elsevier Patient Education  2020Wallacelthy Eating Seguir una modalidad de alimentacin saludable puede ayudarlo a alcaScience writerantTheatre managerpeso saludable, reducir el riesgo de tener enfermedades crnicas y vivir una Ardelia Memsa larga y productiva. Es importante que siga una modalidad de alimentacin saludable con un nivel adecuado de caloras para su cuerpo. Debe cubrir sus necesidades nutricionales principalmente a travs de los alimentos, escogiendo una variedad de alimentos ricos en nutrientes. Cules son algunos consejos para seguir este plan? Lea las etiquetas de los alimentos  Lea las etiquetas y elija las que digan lo siguiente: ? Reducido en sodio o con bajo contenido de sodiAltamontJugos con 100% jugo de fruta. ? Alimentos con bajo contenido de grasas saturadas y alto contenido de grasas poliinsaturadas y monoVeterinary surgeonAlimentos con cereales integrales, como trigo integral, trigo partido, arroz integral y arroz salvaje. ? Cereales integrales fortificados con cido flico. Se recomienda a las mujeres embarazadas o que desean quedar embarazadas.  Lea las etiquetas y evite: ? Los alimentos con una gran  cantidad de azcares agregados. Estos incluyen los alimentos que contienen azcar moreno, endulzante a base de maz, jarabe de maz, dextrosa, fructosa, glucosa, jarabe de maz de alta fructosa, miel, azcar invertido, lactosa, jarabe de [place], maltosa, [place], azcar sin refinar, sacarosa, trehalosa y azcar turbinado.  No consuma ms que las siguientes cantidades de azcar agregada por da:  6 cucharaditas (25 g) las mujeres.  9 cucharaditas (38  g) los hombres. ? Los alimentos que contienen almidones y cereales refinados o procesados. ? Los productos de cereales refinados, como harina blanca, harina de maz desgerminada, pan blanco y arroz blanco. Al ir de compras  Elija refrigerios ricos en nutrientes, como verduras, frutas enteras y frutos secos. Evite los refrigerios con alto contenido de caloras y Location manager, como las papas fritas, los refrigerios frutales y los caramelos.  Use alios y productos para untar a base de aceite con los Building surveyor de grasas slidas como la [place], la margarina en barra o el queso crema.  Limite las salsas, las mezclas y los productos "instantneos" preelaborados como el arroz saborizado, los fideos instantneos y las pastas listas para comer.  Pruebe ms fuentes de protena vegetal, como tofu, tempeh, frijoles negros, edamame, lentejas, frutos secos y semillas.  Explore planes de alimentacin como la dieta mediterrnea o la dieta vegetariana. Al cocinar  Use aceite para Lobbyist de grasas slidas como [place], margarina en barra o [place] de cerdo.  En lugar de frer, trate de cocinar en el horno, en la plancha o en la parrilla, o hervir los alimentos.  Retire la parte grasa de las carnes antes de cocinarlas.  Cocine las verduras al vapor en agua o caldo. Planificacin de las comidas   En las comidas, imagine dividir su plato en cuartos: ? La mitad del plato tiene frutas y verduras. ? Un cuarto del plato tiene cereales integrales. ? Un cuarto del plato tiene protena, especialmente carnes [place], aves, huevos, tofu, frijoles o frutos secos.  Incluya lcteos descremados en su dieta diaria. Estilo de vida  Elija opciones saludables en todos los mbitos, como en el hogar, el [place], la [place], los restaurantes y [place].  Prepare los alimentos de un modo seguro: ? Lvese las manos despus de manipular carnes crudas. ? [place] de  preparacin de los alimentos limpias lavndolas regularmente con agua caliente y [place]. ? Mantenga las carnes crudas separadas de los alimentos que estn listos para comer como las frutas y las verduras. ? Cocine los frutos de mar, carnes, aves y Clinical cytogeneticist la temperatura interna recomendada. ? Almacene los alimentos a temperaturas seguras. En general:  Mantenga los alimentos fros a una temperatura de 37F (4,4C) o inferior.  Mantenga los alimentos calientes a una temperatura de 137F (60C) o superior.  Mantenga el congelador a una temperatura de 0 F (-17,8C) o inferior.  Los alimentos dejan de ser seguros para su consumo cuando han estado a una temperatura de entre 30 y 137F (4,4 y 60C) por ms de 2horas. Qu alimentos debo consumir? Frutas Propngase comer el equivalente a 2tazas de frutas frescas, enlatadas (en su jugo natural) o Wellsite geologist. Algunos ejemplos de equivalentes a 1taza de frutas son 47mnzana pequea, 83fsas grandes, 1taza de fruta enlatada, taza de fruta desecada o 1 taza de jugo 100%. Verduras Propngase comer el equivalente a 2 o 3tazas de verduras frescas y coWellsite geologistincluyendo diferentes variedades y colores. Algunos ejemplos de equivalentes a 1taza de verduras son 2zanahorias  medianas, 2 tazas de verduras de hoja verde crudas, 1taza de verduras cortadas (crudas o cocidas) o 1papa mediana al horno. Granos Propngase comer el equivalente a 6onzas de Dispensing optician. Algunos ejemplos de equivalentes a 1onza de cereales son [place] de pan, 1taza de cereal listo para comer, 3tazas de palomitas de maz o  taza de arroz, pasta o cereales cocidos. Carnes y otras protenas Propngase comer el equivalente a 5 o 6onzas de Tourist information centre manager. Algunos ejemplos de equivalentes a 1 onza de protena son 1huevo, taza de frutos secos o semillas o 1 cucharada (16g) de [place] de man. Un corte de carne o  pescado del tamao de un mazo de cartas equivale aproximadamente a 3 a 4 onzas.  De las protenas que consume cada semana, intente que al menos 8onzas provengan de frutos de mar. Esto incluye el salmn, la [place], el arenque y las [place]. Lcteos Propngase comer el equivalente a 3tazas de lcteos descremados o con bajo contenido de Public librarian. Algunos ejemplos de equivalentes a 1taza de lcteos son 1taza (22m) de lElk Mountain 8onzas (250g) de yEstate agent 1onzas (44g) de queso natural o 1 taza (2448m de leAir traffic controllere soja fortificada. Grasas y aceites  Propngase consumir alrededor de 5 cucharaditas (21g) por da. Elija grasas monoinsaturadas, como el aceite de canola y de olPrairie GroveagIndian Rocks BeachmaEarlye man y laMedia plannere los frutos secos, o bien grasas poliinsaturadas, como el aceite de giElbamaz y soja, nueces, piones, semillas de ssamo, semillas de girasol y semillas de lino. Bebidas  Propngase beber seis vasos de 8 onzas de agPublic affairs consultantLimite el caf a entre tres y cinco tazas de 8 onzas por daTraining and development officer Limite el consumo de bebidas con cafena que tengan caloras agregadas, como los refrescos y las bebidas energizantes.  Limite el consumo de alcohol a no ms de 71m65mda por da si es mujer y no est embDanville a 2me35mas por da si es hombre. Una medida equivale a 12onzas de cerveza (355ml10monzas de vino (148ml)72monzas de bebidas alcohlicas de alta graduacin (44ml).68mdimentos y otros alimentRadiographer, therapeuticades excesivas de sal a los alimentos. Pruebe darles sabor con hierbas y especias en lugar de sal.  Evite agregar azcar a los alimentos.  Pruebe usar alios, salsas y productos untables a base de aceite International aid/development workersas slidas. Esta informacin se basa en las pautas generales de nutricin de los [place]. Para obtener ms informacin, visite choosemBuildDNA.esantidades exactas pueden variar en funcin de sus necesidades nutricionales. Resumen   Un plan de alimentacin saludable puede ayudarlo a mantener un peso saludable, reducir el riesgo de tener enfermedades crnicas y manteneRoseboro durante toda su vida.  Planifique sus comidas. Asegrese de consumir las porciones correctas de una variedad de alimentos ricos en nutrientes.  En lugar de frer, trate de cocinar en el horno, en la plancha o en la parrilla, o hervir los alimentos.  Elija opciones saludables en todos los mbitos, como en el hogar, el trabajo, la escuelaJamestownestaurantes y las tieKensington Parkinformacin no tiene como fiMarine scientistsejo del mdico. Asegrese de hacerle al mdico cualquier pregunta que tenga. Document Released: 03/04/2018 Document Revised: 03/04/2018 Document Reviewed: 03/04/2018 Elsevier Patient Education  2020 ElsevieReyn[employer]

## 2019-06-10 NOTE — Progress Notes (Signed)
Patient ID: Haley Velasquez, female    DOB: 12/16/1990  MRN: [id number]  CC: Annual Exam   Subjective: Haley Velasquez is a 28 y.o. female who presents for well woman exam Her concerns today include:  Patient with history of COVID-19 04/2019.  She was evaluated by nurse practitioner Tiburcio PeaHarris in July at the time that she had COVID infection  Pt is G6P6 -last PAP she thinks was a yr ago but not sure where.  No abn paps in past -Has IUD since 5 yrs ago at [facility], told it will last 10 yrs -sexually active with one partner No vaginal dischg or itching at this.  Menses regular lasting 4 days No fhx of breast, cervical or uterine CA  Past medical history, social history, family history reviewed and updated.  There are no active problems to display for this patient.    No current outpatient medications on file prior to visit.   No current facility-administered medications on file prior to visit.     No Known Allergies  Social History   Socioeconomic History  . Marital status: Single    Spouse name: Not on file  . Number of children: Not on file  . Years of education: Not on file  . Highest education level: Not on file  Occupational History  . Not on file  Social Needs  . Financial resource strain: Not on file  . Food insecurity    Worry: Not on file    Inability: Not on file  . Transportation needs    Medical: Not on file    Non-medical: Not on file  Tobacco Use  . Smoking status: Never Smoker  . Smokeless tobacco: Never Used  Substance and Sexual Activity  . Alcohol use: No  . Drug use: No  . Sexual activity: Yes    Partners: Male    Birth control/protection: Implant, I.U.D.    Comment: 1st intercourse- 12, partners-  3, current partner- 5 yrs   Lifestyle  . Physical activity    Days per week: Not on file    Minutes per session: Not on file  . Stress: Not on file  Relationships  . Social Musicianconnections    Talks on phone: Not on file    Gets together:  Not on file    Attends religious service: Not on file    Active member of club or organization: Not on file    Attends meetings of clubs or organizations: Not on file    Relationship status: Not on file  . Intimate partner violence    Fear of current or ex partner: Not on file    Emotionally abused: Not on file    Physically abused: Not on file    Forced sexual activity: Not on file  Other Topics Concern  . Not on file  Social History Narrative  . Not on file    Family History  Problem Relation Age of Onset  . Diabetes Mother   . Diabetes Father     Past Surgical History:  Procedure Laterality Date  . NO PAST SURGERIES      ROS: Review of Systems  Constitutional: Negative for appetite change and fatigue.  HENT: Negative for congestion and hearing loss.   Eyes:       Last eye exam was about a year ago.  She wears prescription lenses.  Respiratory: Negative for cough and shortness of breath.   Cardiovascular: Negative for chest pain.   Negative except as  stated above  PHYSICAL EXAM: BP 106/68   Pulse (!) 52   Temp (!) 97.3 F (36.3 C) (Temporal)   Resp 17   Ht 4\' 10"  (1.473 m)   Wt 144 lb 9.6 oz (65.6 kg)   SpO2 98%   BMI 30.22 kg/m   Physical Exam  General appearance - alert, well appearing, and in no distress Mental status - normal mood, behavior, speech, dress, motor activity, and thought processes Eyes - pupils equal and reactive, extraocular eye movements intact Ears - bilateral TM's and external ear canals normal Nose - normal and patent, no erythema, discharge or polyps Mouth - mucous membranes moist, pharynx normal without lesions Neck - supple, no significant adenopathy Lymphatics - no palpable lymphadenopathy, no hepatosplenomegaly Chest - clear to auscultation, no wheezes, rales or rhonchi, symmetric air entry Heart - normal rate, regular rhythm, normal S1, S2, no murmurs, rubs, clicks or gallops Abdomen - soft, nontender, nondistended, no masses  or organomegaly Breasts - CMA Walker present: breasts appear normal, no suspicious masses, no skin or nipple changes or axillary nodes Pelvic -CMA walker was present.  Normal external genitalia, vulva, vagina, cervix, uterus and adnexa Extremities - peripheral pulses normal, no pedal edema, no clubbing or cyanosis Skin -numerous dry excoriated areas on the lower extremities  Depression screen [facility] 2/9 04/29/2019  Decreased Interest 0  Down, Depressed, Hopeless 0  PHQ - 2 Score 0    CMP Latest Ref Rng & Units 04/17/2019 09/09/2017 09/29/2015  Glucose 70 - 99 mg/dL 97 94 106(H)  BUN 6 - 20 mg/dL 8 9 10   Creatinine 0.44 - 1.00 mg/dL 0.51 0.49 0.58  Sodium 135 - 145 mmol/L 140 137 138  Potassium 3.5 - 5.1 mmol/L 3.9 3.2(L) 3.7  Chloride 98 - 111 mmol/L 107 108 102  CO2 22 - 32 mmol/L 22 20(L) 28  Calcium 8.9 - 10.3 mg/dL 8.9 8.9 9.6  Total Protein 6.5 - 8.1 g/dL 8.0 8.0 8.3(H)  Total Bilirubin 0.3 - 1.2 mg/dL 0.7 0.5 0.6  Alkaline Phos 38 - 126 U/L 75 92 81  AST 15 - 41 U/L 123(H) 29 25  ALT 0 - 44 U/L 162(H) 27 25   Lipid Panel  No results found for: CHOL, TRIG, HDL, CHOLHDL, VLDL, LDLCALC, LDLDIRECT  CBC    Component Value Date/Time   WBC 6.0 04/17/2019 0911   RBC 4.80 04/17/2019 0911   HGB 13.6 04/17/2019 0911   HCT 41.6 04/17/2019 0911   PLT 384 04/17/2019 0911   MCV 86.7 04/17/2019 0911   MCH 28.3 04/17/2019 0911   MCHC 32.7 04/17/2019 0911   RDW 13.1 04/17/2019 0911   LYMPHSABS 2.2 04/17/2019 0911   MONOABS 0.4 04/17/2019 0911   EOSABS 0.1 04/17/2019 0911   BASOSABS 0.0 04/17/2019 0911    ASSESSMENT AND PLAN: 1. Well woman exam 2. Pap smear for cervical cancer screening - Cytology - PAP([place]) - Cervicovaginal ancillary only  3. Abnormal LFTs LFTs were elevated at the time of COVID infection back in June.  We will recheck LFTs to see if they have normalized - Hepatic Function Panel  4. BMI 30.0-30.9,adult Discussed the importance of healthy eating habits  and regular exercise.  Encouraged her to get in about 150 minutes/week of moderate intensity exercise.  Printed information given on eating habits  5. Routine screening for STI (sexually transmitted infection) - HIV Antibody (routine testing w rflx)  We currently do not have the flu vaccine.  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   Patient was given the opportunity to ask questions.  Patient verbalized understanding of the plan and was able to repeat key elements of the plan.  [employer] interpreter used during this encounter.  #[id number].  Orders Placed This Encounter  Procedures  . HIV Antibody (routine testing w rflx)  . Hepatic Function Panel     Requested Prescriptions    No prescriptions requested or ordered in this encounter    Return if symptoms worsen or fail to improve.  Jonah Blueeborah Breckon Reeves, MD, FACP

## 2019-06-11 LAB — CYTOLOGY - PAP: Diagnosis: NEGATIVE

## 2019-06-11 LAB — HEPATIC FUNCTION PANEL
ALT: 43 IU/L — ABNORMAL HIGH (ref 0–32)
AST: 29 IU/L (ref 0–40)
Albumin: 4.4 g/dL (ref 3.9–5.0)
Alkaline Phosphatase: 77 IU/L (ref 39–117)
Bilirubin Total: 0.6 mg/dL (ref 0.0–1.2)
Bilirubin, Direct: 0.14 mg/dL (ref 0.00–0.40)
Total Protein: 7.5 g/dL (ref 6.0–8.5)

## 2019-06-11 LAB — HIV ANTIBODY (ROUTINE TESTING W REFLEX): HIV Screen 4th Generation wRfx: NONREACTIVE

## 2019-06-12 ENCOUNTER — Other Ambulatory Visit: Payer: Self-pay | Admitting: Internal Medicine

## 2019-06-12 LAB — CERVICOVAGINAL ANCILLARY ONLY
Bacterial vaginitis: POSITIVE — AB
Candida vaginitis: NEGATIVE
Chlamydia: NEGATIVE
Neisseria Gonorrhea: NEGATIVE
Trichomonas: NEGATIVE

## 2019-06-12 MED ORDER — METRONIDAZOLE 500 MG PO TABS: 500.0000 mg | ORAL_TABLET | Freq: Two times a day (BID) | ORAL | 0 refills | Status: DC

## 2019-10-13 ENCOUNTER — Ambulatory Visit (HOSPITAL_COMMUNITY)
Admission: EM | Admit: 2019-10-13 | Discharge: 2019-10-13 | Disposition: A | Discharge status: Home / Self Care | Payer: Self-pay | Attending: Internal Medicine | Admitting: Internal Medicine

## 2019-10-13 ENCOUNTER — Other Ambulatory Visit: Payer: Self-pay

## 2019-10-13 ENCOUNTER — Encounter (HOSPITAL_COMMUNITY): Payer: Self-pay | Admitting: Emergency Medicine

## 2019-10-13 DIAGNOSIS — R300 Dysuria: Secondary | ICD-10-CM | POA: Insufficient documentation

## 2019-10-13 DIAGNOSIS — N390 Urinary tract infection, site not specified: Secondary | ICD-10-CM | POA: Insufficient documentation

## 2019-10-13 DIAGNOSIS — Z3202 Encounter for pregnancy test, result negative: Secondary | ICD-10-CM

## 2019-10-13 LAB — POCT URINALYSIS DIP (DEVICE)
Bilirubin Urine: NEGATIVE
Glucose, UA: NEGATIVE mg/dL
Ketones, ur: NEGATIVE mg/dL
Nitrite: NEGATIVE
Protein, ur: >=300 mg/dL — AB
Specific Gravity, Urine: 1.02 (ref 1.005–1.030)
Urobilinogen, UA: 0.2 mg/dL (ref 0.0–1.0)
pH: 6.5 (ref 5.0–8.0)

## 2019-10-13 LAB — POC URINE PREG, ED: Preg Test, Ur: NEGATIVE

## 2019-10-13 LAB — POCT PREGNANCY, URINE: Preg Test, Ur: NEGATIVE

## 2019-10-13 MED ORDER — KETOROLAC TROMETHAMINE 30 MG/ML IJ SOLN
INTRAMUSCULAR | Status: AC
  Filled 2019-10-13: qty 1

## 2019-10-13 MED ORDER — KETOROLAC TROMETHAMINE 30 MG/ML IJ SOLN
30.0000 mg | Freq: Once | INTRAMUSCULAR | Status: AC
  Administered 2019-10-13: 19:00:00 30 mg via INTRAMUSCULAR

## 2019-10-13 MED ORDER — CEPHALEXIN 500 MG PO CAPS: 500.0000 mg | ORAL_CAPSULE | Freq: Two times a day (BID) | ORAL | 0 refills | Status: AC

## 2019-10-13 MED ORDER — CEFTRIAXONE SODIUM 1 G IJ SOLR
INTRAMUSCULAR | Status: AC
  Filled 2019-10-13: qty 10

## 2019-10-13 MED ORDER — CEFTRIAXONE SODIUM 1 G IJ SOLR
1.0000 g | Freq: Once | INTRAMUSCULAR | Status: AC
  Administered 2019-10-13: 19:00:00 1 g via INTRAMUSCULAR

## 2019-10-13 MED ORDER — PHENAZOPYRIDINE HCL 200 MG PO TABS: 200.0000 mg | ORAL_TABLET | Freq: Three times a day (TID) | ORAL | 0 refills | Status: DC | PRN

## 2019-10-13 NOTE — ED Triage Notes (Addendum)
Pt complains of burning with urination since Friday.  Pt is also a little hunched over when standing up holding her lower abdomen.

## 2019-10-13 NOTE — Discharge Instructions (Signed)
Begin keflex twice daily for 1 week. Drink plenty of water. Pyridium as needed for pain/burning.  We gave you rocephin and toradol before leaving  Vaginal swab should return in 3 days  Please return or follow up with your primary provider if symptoms not improving with treatment. Please return sooner if you have worsening of symptoms or develop fever, nausea, vomiting, abdominal pain, back pain, lightheadedness, dizziness.

## 2019-10-14 NOTE — ED Provider Notes (Signed)
Haley Velasquez    CSN: [id number] Arrival date & time: 10/13/19  1641      History   Chief Complaint Chief Complaint  Patient presents with  . Dysuria    HPI Spanish translation via [employer] interpreter Haley Velasquez is a 28 y.o. female no significant past medical history presenting today for evaluation of possible UTI.  Patient states that over the past couple days she has had a lot of dysuria, burning and pressure in her lower abdomen.  She states that she has history of recurrent UTIs and has UTIs approximately every 3 to 4 months.  Feel similar to prior UTIs.  Also noting she is having urinary frequency and incomplete voiding.  When she has been wiping she has noticed some bleeding.  She believes the bleeding is coming vaginally instead of from the urethra.  She denies any abnormal discharge.  Denies specific concerns for STDs.  Does feel the pain in her lower abdomen is more intense than normal.  She denies fevers, nausea or vomiting.  Does endorse some bilateral lower back pain.  Patient uses IUD for birth control, is concerned about her IUD.  HPI  Past Medical History:  Diagnosis Date  . No pertinent past medical history     There are no problems to display for this patient.   Past Surgical History:  Procedure Laterality Date  . NO PAST SURGERIES      OB History    Gravida  7   Para  6   Term  6   Preterm      AB  1   Living  6     SAB  1   TAB      Ectopic      Multiple      Live Births  6            Home Medications    Prior to Admission medications   Medication Sig Start Date End Date Taking? Authorizing Provider  cephALEXin (KEFLEX) 500 MG capsule Take 1 capsule (500 mg total) by mouth 2 (two) times daily for 7 days. 10/13/19 10/20/19  Lekesha Claw C, PA-C  phenazopyridine (PYRIDIUM) 200 MG tablet Take 1 tablet (200 mg total) by mouth 3 (three) times daily as needed for pain (ardor). 10/13/19   Tisha Cline, Elesa Hacker,  PA-C    Family History Family History  Problem Relation Age of Onset  . Diabetes Mother   . Diabetes Father     Social History Social History   Tobacco Use  . Smoking status: Never Smoker  . Smokeless tobacco: Never Used  Substance Use Topics  . Alcohol use: No  . Drug use: No     Allergies   Patient has no known allergies.   Review of Systems Review of Systems  Constitutional: Negative for fever.  Respiratory: Negative for shortness of breath.   Cardiovascular: Negative for chest pain.  Gastrointestinal: Negative for abdominal pain, diarrhea, nausea and vomiting.  Genitourinary: Positive for dysuria, frequency, hematuria and vaginal bleeding. Negative for flank pain, genital sores, menstrual problem, vaginal discharge and vaginal pain.  Musculoskeletal: Negative for back pain.  Skin: Negative for rash.  Neurological: Negative for dizziness, light-headedness and headaches.     Physical Exam Triage Vital Signs ED Triage Vitals  Enc Vitals Group     BP 10/13/19 1659 (!) 112/48     Pulse Rate 10/13/19 1659 75     Resp 10/13/19 1659 18     Temp  10/13/19 1659 98 F (36.7 C)     Temp Source 10/13/19 1659 Oral     SpO2 10/13/19 1659 100 %     Weight --      Height --      Head Circumference --      Peak Flow --      Pain Score 10/13/19 1700 8     Pain Loc --      Pain Edu? --      Excl. in GC? --    No data found.  Updated Vital Signs BP (!) 112/48 (BP Location: Right Arm)   Pulse 75   Temp 98 F (36.7 C) (Oral)   Resp 18   SpO2 100%   Visual Acuity Right Eye Distance:   Left Eye Distance:   Bilateral Distance:    Right Eye Near:   Left Eye Near:    Bilateral Near:     Physical Exam Vitals and nursing note reviewed.  Constitutional:      Appearance: She is well-developed.     Comments: Appears uncomfortable, holding lower abdomen and is hunched over  HENT:     Head: Normocephalic and atraumatic.     Nose: Nose normal.  Eyes:      Conjunctiva/sclera: Conjunctivae normal.  Cardiovascular:     Rate and Rhythm: Normal rate.  Pulmonary:     Effort: Pulmonary effort is normal. No respiratory distress.     Comments: Breathing comfortably at rest, CTABL, no wheezing, rales or other adventitious sounds auscultated Abdominal:     General: There is no distension.     Comments: Tenderness to palpation of bilateral lower quadrants and suprapubic area, more focal over suprapubic area.  Negative rebound, negative McBurney's, negative Rovsing's  Genitourinary:    Comments: Normal external female genitalia, vaginal mucosa pink, cervix pink, IUD strings visualized Musculoskeletal:        General: Normal range of motion.     Cervical back: Neck supple.  Skin:    General: Skin is warm and dry.  Neurological:     Mental Status: She is alert and oriented to person, place, and time.      UC Treatments / Results  Labs (all labs ordered are listed, but only abnormal results are displayed) Labs Reviewed  POCT URINALYSIS DIP (DEVICE) - Abnormal; Notable for the following components:      Result Value   Hgb urine dipstick LARGE (*)    Protein, ur >=300 (*)    Leukocytes,Ua SMALL (*)    All other components within normal limits  URINE CULTURE  POCT PREGNANCY, URINE  POC URINE PREG, ED  CERVICOVAGINAL ANCILLARY ONLY    EKG   Radiology No results found.  Procedures Procedures (including critical care time)  Medications Ordered in UC Medications  cefTRIAXone (ROCEPHIN) injection 1 g (1 g Intramuscular Given 10/13/19 1833)  ketorolac (TORADOL) 30 MG/ML injection 30 mg (30 mg Intramuscular Given 10/13/19 1833)    Initial Impression / Assessment and Plan / UC Course  I have reviewed the triage vital signs and the nursing notes.  Pertinent labs & imaging results that were available during my care of the patient were reviewed by me and considered in my medical decision making (see chart for details).     Vital signs  stable without fever or tachycardia, small leuks on UA, large hemoglobin, will treat for UTI and will go ahead and provide 1 g of Rocephin prior to discharge along with Toradol given patient's discomfort.  Continuing on Keflex twice daily x1 week.  Urine culture pending, will call with results and provide further treatment as needed.  Also obtained vaginal swab to evaluate for any vaginal infections contributing to discomfort.  Discussed strict return precautions. Patient verbalized understanding and is agreeable with plan.  Final Clinical Impressions(s) / UC Diagnoses   Final diagnoses:  Lower urinary tract infectious disease  Dysuria     Discharge Instructions     Begin keflex twice daily for 1 week. Drink plenty of water. Pyridium as needed for pain/burning.  We gave you rocephin and toradol before leaving  Vaginal swab should return in 3 days  Please return or follow up with your primary provider if symptoms not improving with treatment. Please return sooner if you have worsening of symptoms or develop fever, nausea, vomiting, abdominal pain, back pain, lightheadedness, dizziness.   ED Prescriptions    Medication Sig Dispense Auth. Provider   cephALEXin (KEFLEX) 500 MG capsule Take 1 capsule (500 mg total) by mouth 2 (two) times daily for 7 days. 14 capsule Emry Tobin C, PA-C   phenazopyridine (PYRIDIUM) 200 MG tablet Take 1 tablet (200 mg total) by mouth 3 (three) times daily as needed for pain (ardor). 6 tablet Alandra Sando, [place] Lake[place] C, PA-C     PDMP not reviewed this encounter.   Lew DawesWieters, Saree Krogh C, PA-C 10/14/19 1028

## 2019-10-15 ENCOUNTER — Telehealth: Payer: Self-pay | Admitting: Emergency Medicine

## 2019-10-15 LAB — CERVICOVAGINAL ANCILLARY ONLY
Bacterial vaginitis: POSITIVE — AB
Candida vaginitis: NEGATIVE
Chlamydia: NEGATIVE
Neisseria Gonorrhea: NEGATIVE
Trichomonas: NEGATIVE

## 2019-10-15 LAB — URINE CULTURE: Culture: 30000 — AB

## 2019-10-15 MED ORDER — METRONIDAZOLE 500 MG PO TABS: 500.0000 mg | ORAL_TABLET | Freq: Two times a day (BID) | ORAL | 0 refills | Status: AC

## 2019-10-15 NOTE — Telephone Encounter (Signed)
Bacterial vaginosis is positive. This was not treated at the urgent care visit.  Flagyl 500 mg BID x 7 days #14 no refills sent to patients pharmacy of choice.    With spanish interpreter, called pt, no answer, left VM to return call.

## 2019-10-16 ENCOUNTER — Telehealth: Payer: Self-pay | Admitting: Emergency Medicine

## 2019-10-16 NOTE — Telephone Encounter (Signed)
With spanish interpreter, Patient contacted by phone and made aware of    results. Pt verbalized understanding and had all questions answered.

## 2022-06-27 ENCOUNTER — Ambulatory Visit
Admission: RE | Admit: 2022-06-27 | Discharge: 2022-06-27 | Disposition: A | Discharge status: Home / Self Care | Payer: Self-pay | Source: Ambulatory Visit | Attending: Family Medicine | Admitting: Family Medicine

## 2022-06-27 ENCOUNTER — Other Ambulatory Visit: Payer: Self-pay | Admitting: Family Medicine

## 2022-06-27 DIAGNOSIS — R42 Dizziness and giddiness: Secondary | ICD-10-CM

## 2022-06-27 DIAGNOSIS — K921 Melena: Secondary | ICD-10-CM

## 2022-06-27 DIAGNOSIS — R1033 Periumbilical pain: Secondary | ICD-10-CM

## 2022-06-27 DIAGNOSIS — R110 Nausea: Secondary | ICD-10-CM

## 2023-10-10 ENCOUNTER — Other Ambulatory Visit (HOSPITAL_COMMUNITY)
Admission: RE | Admit: 2023-10-10 | Discharge: 2023-10-10 | Disposition: A | Discharge status: Home / Self Care | Payer: Medicaid Other | Source: Ambulatory Visit | Attending: Family Medicine | Admitting: Family Medicine

## 2023-10-10 ENCOUNTER — Ambulatory Visit (INDEPENDENT_AMBULATORY_CARE_PROVIDER_SITE_OTHER): Payer: Medicaid Other | Admitting: General Practice

## 2023-10-10 ENCOUNTER — Encounter: Payer: Self-pay | Admitting: General Practice

## 2023-10-10 ENCOUNTER — Other Ambulatory Visit: Payer: Self-pay

## 2023-10-10 VITALS — BP 105/65 | HR 58 | Wt 147.0 lb

## 2023-10-10 DIAGNOSIS — O26892 Other specified pregnancy related conditions, second trimester: Secondary | ICD-10-CM

## 2023-10-10 DIAGNOSIS — Z3A17 17 weeks gestation of pregnancy: Secondary | ICD-10-CM

## 2023-10-10 DIAGNOSIS — Z3490 Encounter for supervision of normal pregnancy, unspecified, unspecified trimester: Secondary | ICD-10-CM | POA: Insufficient documentation

## 2023-10-10 DIAGNOSIS — Z3492 Encounter for supervision of normal pregnancy, unspecified, second trimester: Secondary | ICD-10-CM | POA: Insufficient documentation

## 2023-10-10 DIAGNOSIS — R12 Heartburn: Secondary | ICD-10-CM | POA: Diagnosis not present

## 2023-10-10 MED ORDER — FAMOTIDINE 20 MG PO TABS: 20.0000 mg | ORAL_TABLET | Freq: Two times a day (BID) | ORAL | 1 refills | Status: DC

## 2023-10-10 NOTE — Progress Notes (Signed)
New OB Intake  I connected with Greig Right  on 10/10/23 at 10:15 AM EST in person at [facility]  I discussed the limitations, risks, security and privacy concerns of performing an evaluation and management service by telephone and the availability of in person appointments. I also discussed with the patient that there may be a patient responsible charge related to this service. The patient expressed understanding and agreed to proceed.  I explained I am completing New OB Intake today. We discussed EDD of 03/17/2024, by Last Menstrual Period. Pt is G7P6006. I reviewed her allergies, medications and Medical/Surgical/OB history.    Patient Active Problem List   Diagnosis Date Noted   Supervision of low-risk pregnancy 10/10/2023    Concerns addressed today Patient reports difficulty with heartburn, would like Rx. Rx sent to pharmacy per protocol.   Delivery Plans Plans to deliver at [facility]. Discussed the nature of our practice with multiple providers including residents and students. Due to the size of the practice, the delivering provider may not be the same as those providing prenatal care.   Patient is not interested in water birth.   MyChart/Babyscripts MyChart access verified. I explained pt will have some visits in office and some virtually. Babyscripts instructions given and order placed. Patient verifies receipt of registration text/e-mail. Account successfully created and app downloaded. If patient is a candidate for Optimized scheduling, add to sticky note.   Blood Pressure Cuff/Weight Scale Not addressed  Anatomy US Explained first scheduled [place] will be around 19 weeks. Anatomy US scheduled for 1/16 at 8:15am.  Is patient a CenteringPregnancy candidate?  Considering centering, will let us know    Is patient a Mom+Baby Combined Care candidate?  Not a candidate     Interested in [place]? If yes, send referral and doula dot phrase.  no  Is patient a candidate for  Babyscripts Optimization? Not a candidate- speaks spanish   First visit review I reviewed new OB appt with patient. Explained pt will be seen by Dr Vergie Living at first visit. Discussed Avelina Laine genetic screening with patient. Patient would like [employer] officer and [employer] today. Routine prenatal labs collected today   Last Pap June 2023 normal at [facility], [place] 10/10/2023  10:28 AM

## 2023-10-11 LAB — GC/CHLAMYDIA PROBE AMP (~~LOC~~) NOT AT ARMC
Chlamydia: NEGATIVE
Comment: NEGATIVE
Comment: NORMAL
Neisseria Gonorrhea: NEGATIVE

## 2023-10-12 LAB — CULTURE, OB URINE

## 2023-10-12 LAB — URINE CULTURE, OB REFLEX: Organism ID, Bacteria: NO GROWTH

## 2023-10-13 LAB — AFP, SERUM, OPEN SPINA BIFIDA
AFP MoM: 0.72
AFP Value: 28.4 ng/mL
Gest. Age on Collection Date: 17.3 wk
Maternal Age At EDD: 33.2 a
OSBR Risk 1 IN: 10000
Test Results:: NEGATIVE
Weight: 147 [lb_av]

## 2023-10-13 LAB — CBC/D/PLT+RPR+RH+ABO+RUBIGG...
Antibody Screen: NEGATIVE
Basophils Absolute: 0 10*3/uL (ref 0.0–0.2)
Basos: 0 %
EOS (ABSOLUTE): 0.1 10*3/uL (ref 0.0–0.4)
Eos: 1 %
HCV Ab: NONREACTIVE
HIV Screen 4th Generation wRfx: NONREACTIVE
Hematocrit: 36.5 % (ref 34.0–46.6)
Hemoglobin: 12.3 g/dL (ref 11.1–15.9)
Hepatitis B Surface Ag: NEGATIVE
Immature Grans (Abs): 0 10*3/uL (ref 0.0–0.1)
Immature Granulocytes: 0 %
Lymphocytes Absolute: 1.8 10*3/uL (ref 0.7–3.1)
Lymphs: 21 %
MCH: 30.8 pg (ref 26.6–33.0)
MCHC: 33.7 g/dL (ref 31.5–35.7)
MCV: 91 fL (ref 79–97)
Monocytes Absolute: 0.4 10*3/uL (ref 0.1–0.9)
Monocytes: 5 %
Neutrophils Absolute: 6 10*3/uL (ref 1.4–7.0)
Neutrophils: 73 %
Platelets: 390 10*3/uL (ref 150–450)
RBC: 4 x10E6/uL (ref 3.77–5.28)
RDW: 13.9 % (ref 11.7–15.4)
RPR Ser Ql: NONREACTIVE
Rh Factor: POSITIVE
Rubella Antibodies, IGG: 2.53 {index} (ref >0.99)
WBC: 8.4 10*3/uL (ref 3.4–10.8)

## 2023-10-13 LAB — HEMOGLOBIN A1C
Est. average glucose Bld gHb Est-mCnc: 114 mg/dL
Hgb A1c MFr Bld: 5.6 % (ref 4.8–5.6)

## 2023-10-13 LAB — HCV INTERPRETATION

## 2023-10-17 LAB — PANORAMA PRENATAL TEST FULL PANEL:PANORAMA TEST PLUS 5 ADDITIONAL MICRODELETIONS: FETAL FRACTION: 11.8

## 2023-10-17 LAB — HORIZON CUSTOM: REPORT SUMMARY: NEGATIVE

## 2023-10-24 NOTE — L&D Delivery Note (Cosign Needed Addendum)
 OB/GYN Faculty Practice Delivery Note  Haley Velasquez is a 33 y.o. [id number] s/p SVD at [redacted]w[redacted]d. She was admitted for eIOL.   ROM: 0h 52m with moderate meconium-stained fluid GBS Status: Negative/-- (05/05 1442) Maximum Maternal Temperature: Temp (24hrs), Avg:97.8 F (36.6 C), Min:97.5 F (36.4 C), Max:98 F (36.7 C)   Labor Progress: Initial SVE: 3/50/-3. AROM and Pitocin  required. She then progressed to complete.   Delivery Date/Time: 03/22/24 2049 Delivery: Variable decelerations after AROM. Complete and +1. Pushed quickly. Head delivered OA to LOA. nuchal x1 present which was reduced via somersault maneuver after delivery. True knot present in the cord. Shoulder and body delivered in usual fashion. Infant with spontaneous cry, placed on mother's abdomen, dried and stimulated. Cord clamped x 2 after +1-minute delay, and cut by FOB. Cord gases not obtained. Cord blood drawn. Placenta delivered spontaneously with gentle cord traction. Fundus firm with massage and Pitocin . TXA given for prophylaxis given grand multiparity. Labia, perineum, and vagina inspected with right periurethral which was repaired with a figure-of-eight 4-0 vicryl for hemostasis. Mom and baby to postpartum. EBL 150.  2106: Received call that patient had several small clots with first fundal rub and moderate tone. In-and-out cath performed, and methergine given with improvement in tone. EBL 350. Will give methergine series PP.  Baby Weight: pending  Placenta: 3 vessel, intact. Sent to L&D Complications: None Lacerations: R periurethral EBL: 350 mL Anesthesia: epidural  Infant: baby boy Haley Velasquez APGAR (1 MIN): 7  APGAR (5 MINS): 9  APGAR (10 MINS):    Maud Sorenson, MD [facility] Family Medicine Fellow, [facility] for [facility], [facility] 03/22/2024, 9:16 PM

## 2023-11-01 DIAGNOSIS — O0940 Supervision of pregnancy with grand multiparity, unspecified trimester: Secondary | ICD-10-CM | POA: Insufficient documentation

## 2023-11-05 ENCOUNTER — Other Ambulatory Visit: Payer: Self-pay

## 2023-11-05 ENCOUNTER — Ambulatory Visit: Payer: Medicaid Other | Admitting: Obstetrics and Gynecology

## 2023-11-05 VITALS — BP 97/63 | HR 62 | Wt 150.3 lb

## 2023-11-05 DIAGNOSIS — O0932 Supervision of pregnancy with insufficient antenatal care, second trimester: Secondary | ICD-10-CM | POA: Diagnosis not present

## 2023-11-05 DIAGNOSIS — Z1332 Encounter for screening for maternal depression: Secondary | ICD-10-CM

## 2023-11-05 DIAGNOSIS — Z23 Encounter for immunization: Secondary | ICD-10-CM

## 2023-11-05 DIAGNOSIS — O0942 Supervision of pregnancy with grand multiparity, second trimester: Secondary | ICD-10-CM

## 2023-11-05 DIAGNOSIS — Z3A21 21 weeks gestation of pregnancy: Secondary | ICD-10-CM

## 2023-11-05 DIAGNOSIS — Z758 Other problems related to medical facilities and other health care: Secondary | ICD-10-CM

## 2023-11-05 DIAGNOSIS — Z603 Acculturation difficulty: Secondary | ICD-10-CM

## 2023-11-05 NOTE — Progress Notes (Signed)
 New OB Note  11/05/2023   Clinic: Center for [facility]  Chief Complaint: new OB  Transfer of Care Patient: no  History of Present Illness: Ms. Arrabella Westerman is a 33 y.o. [id number] at 21/0 weeks [facility] 5/26 [tenative], based on Patient's last menstrual period was 06/11/2023.).  Preg complicated by has Supervision of low-risk pregnancy and Grand multiparity with antenatal problem on their problem list.   Only b/l nipple discharge that is white; no lumps or bumps, skin changes.  ROS: A 12-point review of systems was performed and negative, except as stated in the above HPI.  OBGYN History: As per HPI. OB History  Gravida Para Term Preterm AB Living  7 6 6  0 0 6  SAB IAB Ectopic Multiple Live Births  0 0 0 0 6    # Outcome Date GA Lbr Len/2nd Weight Sex Type Anes PTL Lv  7 Current           6 Term 11/22/12 [redacted]w[redacted]d 04:02 / 00:11 7 lb 3.9 oz (3.285 kg) F Vag-Spont None  LIV  5 Term 08/01/11    F Vag-Spont None  LIV  4 Term 02/15/09    M Vag-Spont None  LIV  3 Term 08/31/07    M Vag-Spont None  LIV  2 Term 12/03/05    M Vag-Spont None  LIV  1 Term 08/01/04    F Vag-Spont None  LIV   Pap neg 2024  Past Medical History: Past Medical History:  Diagnosis Date   Medical history non-contributory    No pertinent past medical history     Past Surgical History: Past Surgical History:  Procedure Laterality Date   NO PAST SURGERIES      Family History:  Family History  Problem Relation Age of Onset   Diabetes Mother    Diabetes Father     Social History:  Social History   Socioeconomic History   Marital status: Single    Spouse name: Not on file   Number of children: Not on file   Years of education: Not on file   Highest education level: Not on file  Occupational History   Not on file  Tobacco Use   Smoking status: Never    Passive exposure: Never   Smokeless tobacco: Never  Vaping Use   Vaping status: Never Used  Substance and Sexual  Activity   Alcohol use: Not Currently   Drug use: No   Sexual activity: Not Currently    Partners: Male    Comment: 1st intercourse- 12, partners-  3, current partner- 5 yrs   Other Topics Concern   Not on file  Social History Narrative   Not on file   Social Drivers of Health   Financial Resource Strain: Not on File (02/09/2022)   Received from [employer], [employer]    Financial Resource Strain: 0  Food Insecurity: No Food Insecurity (10/10/2023)   Hunger Vital Sign    Worried About Running Out of Food in the Last Year: Never true    Ran Out of Food in the Last Year: Never true  Transportation Needs: No Transportation Needs (10/10/2023)   PRAPARE - Administrator, Civil Service (Medical): No    Lack of Transportation (Non-Medical): No  Physical Activity: Not on File (02/09/2022)   Received from [address], [place]   Physical Activity    Physical Activity: 0  Stress: Not on File (02/09/2022)   [facility]  Stress    Stress: 0  Social Connections: Not on File (07/07/2023)   Received from [employer]   Social Connections    Connectedness: 0  Intimate Partner Violence: Not on file    Allergy: No Known Allergies  Current Outpatient Medications: Prenatal vitamin   Physical Exam:   BP 97/63   Pulse 62   Wt 150 lb 5 oz (68.2 kg)   LMP 06/11/2023   BMI 31.42 kg/m  Body mass index is 31.42 kg/m. Contractions: Not present Vag. Bleeding: None. FHTs: 150s  General appearance: Well nourished, well developed female in no acute distress.  Neck:  Supple, normal appearance, and no thyromegaly  Cardiovascular: S1, S2 normal, no murmur, rub or gallop, regular rate and rhythm Respiratory:  Clear to auscultation bilateral. Normal respiratory effort Abdomen: positive bowel sounds and no masses, hernias; diffusely non tender to palpation, non distended Neuro/Psych:  Normal mood and affect.  Skin:  Warm and dry.    Laboratory: reviewed  Imaging:  none  Assessment: patient doing well  Plan: 1. Need for vaccination (Primary) - Flu vaccine trivalent PF, 6mos and older(Flulaval,Afluria,Fluarix,Fluzone)  2. [redacted] weeks gestation of pregnancy Routine care. D/w her concerning breast changes to let us  know about - Flu vaccine trivalent PF, 6mos and older(Flulaval,Afluria,Fluarix,Fluzone)  3. Grand multiparity with antenatal problem in second trimester Btl papers signed today  In person interpreter used  Problem list reviewed and updated.  Follow up in 4 weeks.   >50% of 30 min visit spent on counseling and coordination of care.  Return in about 1 week (around 11/12/2023) for low risk ob, md or app, in person.  Future Appointments  Date Time Provider Department Center  11/08/2023  8:15 AM [facility] NURSE [facility]  11/08/2023  8:30 AM [facility] US3 [facility]    Bebe Izell Overcast MD Attending Center for [facility])

## 2023-11-08 ENCOUNTER — Other Ambulatory Visit: Payer: Self-pay | Admitting: *Deleted

## 2023-11-08 ENCOUNTER — Ambulatory Visit: Payer: Medicaid Other | Attending: Obstetrics and Gynecology

## 2023-11-08 ENCOUNTER — Encounter: Payer: Self-pay | Admitting: *Deleted

## 2023-11-08 ENCOUNTER — Ambulatory Visit: Payer: Self-pay | Admitting: *Deleted

## 2023-11-08 VITALS — BP 94/41 | HR 55

## 2023-11-08 DIAGNOSIS — Z3492 Encounter for supervision of normal pregnancy, unspecified, second trimester: Secondary | ICD-10-CM | POA: Diagnosis not present

## 2023-11-08 DIAGNOSIS — Z3A21 21 weeks gestation of pregnancy: Secondary | ICD-10-CM | POA: Insufficient documentation

## 2023-11-08 DIAGNOSIS — O0932 Supervision of pregnancy with insufficient antenatal care, second trimester: Secondary | ICD-10-CM | POA: Diagnosis not present

## 2023-11-08 DIAGNOSIS — Z363 Encounter for antenatal screening for malformations: Secondary | ICD-10-CM | POA: Insufficient documentation

## 2023-11-08 DIAGNOSIS — O0942 Supervision of pregnancy with grand multiparity, second trimester: Secondary | ICD-10-CM | POA: Diagnosis present

## 2023-11-08 DIAGNOSIS — Z362 Encounter for other antenatal screening follow-up: Secondary | ICD-10-CM

## 2023-12-06 ENCOUNTER — Other Ambulatory Visit: Payer: Self-pay

## 2023-12-06 ENCOUNTER — Ambulatory Visit: Payer: Medicaid Other

## 2023-12-06 ENCOUNTER — Ambulatory Visit: Payer: Medicaid Other | Admitting: Obstetrics & Gynecology

## 2023-12-06 ENCOUNTER — Ambulatory Visit: Payer: Medicaid Other | Attending: Obstetrics and Gynecology | Admitting: *Deleted

## 2023-12-06 VITALS — BP 103/57

## 2023-12-06 VITALS — BP 107/56 | HR 67 | Wt 150.7 lb

## 2023-12-06 DIAGNOSIS — O0942 Supervision of pregnancy with grand multiparity, second trimester: Secondary | ICD-10-CM

## 2023-12-06 DIAGNOSIS — Z362 Encounter for other antenatal screening follow-up: Secondary | ICD-10-CM | POA: Insufficient documentation

## 2023-12-06 DIAGNOSIS — Z3A25 25 weeks gestation of pregnancy: Secondary | ICD-10-CM | POA: Diagnosis not present

## 2023-12-06 DIAGNOSIS — O0932 Supervision of pregnancy with insufficient antenatal care, second trimester: Secondary | ICD-10-CM | POA: Insufficient documentation

## 2023-12-06 DIAGNOSIS — O321XX0 Maternal care for breech presentation, not applicable or unspecified: Secondary | ICD-10-CM | POA: Insufficient documentation

## 2023-12-06 DIAGNOSIS — Z758 Other problems related to medical facilities and other health care: Secondary | ICD-10-CM

## 2023-12-06 DIAGNOSIS — Z3492 Encounter for supervision of normal pregnancy, unspecified, second trimester: Secondary | ICD-10-CM

## 2023-12-06 DIAGNOSIS — Z603 Acculturation difficulty: Secondary | ICD-10-CM

## 2023-12-06 NOTE — Progress Notes (Unsigned)
   PRENATAL VISIT NOTE  Subjective:  Haley Velasquez is a 33 y.o. G7P6006 at [redacted]w[redacted]d being seen today for ongoing prenatal care.  She is currently monitored for the following issues for this low-risk pregnancy and has Supervision of low-risk pregnancy; Grand multiparity with antenatal problem; Late prenatal care affecting pregnancy in second trimester; and Language barrier on their problem list.  Patient reports no complaints.  Contractions: Not present. Vag. Bleeding: None.  Movement: Present. Denies leaking of fluid.   The following portions of the patient's history were reviewed and updated as appropriate: allergies, current medications, past family history, past medical history, past social history, past surgical history and problem list.   Objective:   Vitals:   12/06/23 0911  BP: (!) 107/56  Pulse: 67  Weight: 150 lb 11.2 oz (68.4 kg)    Fetal Status: Fetal Heart Rate (bpm): 142   Movement: Present     General:  Alert, oriented and cooperative. Patient is in no acute distress.  Skin: Skin is warm and dry. No rash noted.   Cardiovascular: Normal heart rate noted  Respiratory: Normal respiratory effort, no problems with respiration noted  Abdomen: Soft, gravid, appropriate for gestational age.  Pain/Pressure: Absent     Pelvic: Cervical exam deferred        Extremities: Normal range of motion.  Edema: Trace (fett and legs)  Mental Status: Normal mood and affect. Normal behavior. Normal judgment and thought content.   Assessment and Plan:  Pregnancy: G7P6006 at [redacted]w[redacted]d 1. Late prenatal care affecting pregnancy in second trimester (Primary) F/u US today  2. Language barrier Spanish interpreter  3. Grand multiparity with antenatal problem in second trimester G7P6006   4. Encounter for supervision of low-risk pregnancy in second trimester   Preterm labor symptoms and general obstetric precautions including but not limited to vaginal bleeding, contractions, leaking of  fluid and fetal movement were reviewed in detail with the patient. Please refer to After Visit Summary for other counseling recommendations.   Return in about 4 weeks (around 01/03/2024).  Future Appointments  Date Time Provider Department Center  12/06/2023  2:15 PM [facility] NURSE [facility]  12/06/2023  2:30 PM [facility] US3 [facility]    Scheryl Darter, MD

## 2024-01-03 ENCOUNTER — Other Ambulatory Visit: Payer: Self-pay

## 2024-01-03 DIAGNOSIS — Z3A29 29 weeks gestation of pregnancy: Secondary | ICD-10-CM

## 2024-01-04 ENCOUNTER — Other Ambulatory Visit: Payer: Medicaid Other

## 2024-01-04 ENCOUNTER — Other Ambulatory Visit: Payer: Self-pay

## 2024-01-04 ENCOUNTER — Ambulatory Visit: Payer: Medicaid Other | Admitting: Obstetrics and Gynecology

## 2024-01-04 VITALS — BP 94/61 | HR 73 | Wt 155.3 lb

## 2024-01-04 DIAGNOSIS — Z3A29 29 weeks gestation of pregnancy: Secondary | ICD-10-CM

## 2024-01-04 DIAGNOSIS — O0942 Supervision of pregnancy with grand multiparity, second trimester: Secondary | ICD-10-CM

## 2024-01-04 DIAGNOSIS — Z3493 Encounter for supervision of normal pregnancy, unspecified, third trimester: Secondary | ICD-10-CM

## 2024-01-04 DIAGNOSIS — O0933 Supervision of pregnancy with insufficient antenatal care, third trimester: Secondary | ICD-10-CM | POA: Diagnosis not present

## 2024-01-04 DIAGNOSIS — Z758 Other problems related to medical facilities and other health care: Secondary | ICD-10-CM

## 2024-01-04 DIAGNOSIS — Z23 Encounter for immunization: Secondary | ICD-10-CM | POA: Diagnosis not present

## 2024-01-04 DIAGNOSIS — Z603 Acculturation difficulty: Secondary | ICD-10-CM | POA: Diagnosis not present

## 2024-01-04 DIAGNOSIS — O0932 Supervision of pregnancy with insufficient antenatal care, second trimester: Secondary | ICD-10-CM

## 2024-01-04 DIAGNOSIS — O0943 Supervision of pregnancy with grand multiparity, third trimester: Secondary | ICD-10-CM

## 2024-01-04 NOTE — Progress Notes (Signed)
   PRENATAL VISIT NOTE  Subjective:  Haley Velasquez is a 33 y.o. G7P6006 at [redacted]w[redacted]d being seen today for ongoing prenatal care.  She is currently monitored for the following issues for this low-risk pregnancy and has Supervision of low-risk pregnancy; Grand multiparity with antenatal problem; Late prenatal care affecting pregnancy in second trimester; and Language barrier on their problem list.  Patient reports no complaints.   . Vag. Bleeding: None.  Movement: Present. Denies leaking of fluid.   The following portions of the patient's history were reviewed and updated as appropriate: allergies, current medications, past family history, past medical history, past social history, past surgical history and problem list.   Objective:   Vitals:   01/04/24 0840  BP: 94/61  Pulse: 73  Weight: 155 lb 4.8 oz (70.4 kg)    Fetal Status: Fetal Heart Rate (bpm): 139 Fundal Height: 28 cm Movement: Present     General:  Alert, oriented and cooperative. Patient is in no acute distress.  Skin: Skin is warm and dry. No rash noted.   Cardiovascular: Normal heart rate noted  Respiratory: Normal respiratory effort, no problems with respiration noted  Abdomen: Soft, gravid, appropriate for gestational age.  Pain/Pressure: Absent     Pelvic: Cervical exam deferred        Extremities: Normal range of motion.  Edema: Trace  Mental Status: Normal mood and affect. Normal behavior. Normal judgment and thought content.   Assessment and Plan:  Pregnancy: G7P6006 at [redacted]w[redacted]d 1. Encounter for supervision of low-risk pregnancy in third trimester (Primary) BP and FHR normal Doing well, feeling regular movement   - Tdap vaccine greater than or equal to 7yo IM  2. Late prenatal care affecting pregnancy in second trimester   3. Language barrier Spanish interpreter  4. Grand multiparity with antenatal problem in second trimester  5. [redacted] weeks gestation of pregnancy GTT and labs today Tdap given  today BTL papers signed previously Planning to breast and bottle feed   Preterm labor symptoms and general obstetric precautions including but not limited to vaginal bleeding, contractions, leaking of fluid and fetal movement were reviewed in detail with the patient. Please refer to After Visit Summary for other counseling recommendations.   Return in two weeks for routine prenatal  Future Appointments  Date Time Provider Department Center  01/18/2024  9:55 AM Sue Lush, FNP [facility]    Albertine Grates, FNP

## 2024-01-05 LAB — GLUCOSE TOLERANCE, 2 HOURS W/ 1HR
Glucose, 1 hour: 137 mg/dL (ref 70–179)
Glucose, 2 hour: 131 mg/dL (ref 70–152)
Glucose, Fasting: 83 mg/dL (ref 70–91)

## 2024-01-05 LAB — CBC
Hematocrit: 34.3 % (ref 34.0–46.6)
Hemoglobin: 11.2 g/dL (ref 11.1–15.9)
MCH: 29.3 pg (ref 26.6–33.0)
MCHC: 32.7 g/dL (ref 31.5–35.7)
MCV: 90 fL (ref 79–97)
Platelets: 344 10*3/uL (ref 150–450)
RBC: 3.82 x10E6/uL (ref 3.77–5.28)
RDW: 13.1 % (ref 11.7–15.4)
WBC: 8.4 10*3/uL (ref 3.4–10.8)

## 2024-01-05 LAB — HIV ANTIBODY (ROUTINE TESTING W REFLEX): HIV Screen 4th Generation wRfx: NONREACTIVE

## 2024-01-05 LAB — RPR: RPR Ser Ql: NONREACTIVE

## 2024-01-18 ENCOUNTER — Other Ambulatory Visit: Payer: Self-pay

## 2024-01-18 ENCOUNTER — Ambulatory Visit: Admitting: Obstetrics and Gynecology

## 2024-01-18 ENCOUNTER — Other Ambulatory Visit (HOSPITAL_COMMUNITY)
Admission: RE | Admit: 2024-01-18 | Discharge: 2024-01-18 | Disposition: A | Discharge status: Home / Self Care | Source: Ambulatory Visit | Attending: Obstetrics and Gynecology | Admitting: Obstetrics and Gynecology

## 2024-01-18 VITALS — BP 97/48 | HR 58 | Wt 156.0 lb

## 2024-01-18 DIAGNOSIS — O0943 Supervision of pregnancy with grand multiparity, third trimester: Secondary | ICD-10-CM

## 2024-01-18 DIAGNOSIS — O0933 Supervision of pregnancy with insufficient antenatal care, third trimester: Secondary | ICD-10-CM

## 2024-01-18 DIAGNOSIS — Z758 Other problems related to medical facilities and other health care: Secondary | ICD-10-CM

## 2024-01-18 DIAGNOSIS — Z3A31 31 weeks gestation of pregnancy: Secondary | ICD-10-CM | POA: Diagnosis not present

## 2024-01-18 DIAGNOSIS — Z603 Acculturation difficulty: Secondary | ICD-10-CM | POA: Diagnosis not present

## 2024-01-18 DIAGNOSIS — N898 Other specified noninflammatory disorders of vagina: Secondary | ICD-10-CM

## 2024-01-18 DIAGNOSIS — O0932 Supervision of pregnancy with insufficient antenatal care, second trimester: Secondary | ICD-10-CM

## 2024-01-18 DIAGNOSIS — O0942 Supervision of pregnancy with grand multiparity, second trimester: Secondary | ICD-10-CM

## 2024-01-18 DIAGNOSIS — Z3493 Encounter for supervision of normal pregnancy, unspecified, third trimester: Secondary | ICD-10-CM

## 2024-01-18 MED ORDER — TERCONAZOLE 0.8 % VA CREA: 1.0000 | TOPICAL_CREAM | Freq: Every day | VAGINAL | 0 refills | Status: DC

## 2024-01-18 NOTE — Progress Notes (Signed)
 Patient reports irritation, white/clumpy discharge, & raw/red skin around vulva and discomfort with underwear x 3 days.

## 2024-01-18 NOTE — Progress Notes (Signed)
   PRENATAL VISIT NOTE  Subjective:  Haley Velasquez is a 33 y.o. G7P6006 at [redacted]w[redacted]d being seen today for ongoing prenatal care.  She is currently monitored for the following issues for this low-risk pregnancy and has Supervision of low-risk pregnancy; Grand multiparity with antenatal problem; Late prenatal care affecting pregnancy in second trimester; and Language barrier on their problem list.  Patient reports  vulvar irritation, vaginal discharge .  Contractions: Irritability. Vag. Bleeding: None.  Movement: Present. Denies leaking of fluid.   The following portions of the patient's history were reviewed and updated as appropriate: allergies, current medications, past family history, past medical history, past social history, past surgical history and problem list.   Objective:   Vitals:   01/18/24 1000  BP: (!) 97/48  Pulse: (!) 58  Weight: 156 lb (70.8 kg)    Fetal Status: Fetal Heart Rate (bpm): 130   Movement: Present     General:  Alert, oriented and cooperative. Patient is in no acute distress.  Skin: Skin is warm and dry. No rash noted.   Cardiovascular: Normal heart rate noted  Respiratory: Normal respiratory effort, no problems with respiration noted  Abdomen: Soft, gravid, appropriate for gestational age.  Pain/Pressure: Present     Pelvic: Cervical exam deferred      vaginal erythema   Extremities: Normal range of motion.  Edema: Trace  Mental Status: Normal mood and affect. Normal behavior. Normal judgment and thought content.   Assessment and Plan:  Pregnancy: G7P6006 at [redacted]w[redacted]d 1. Encounter for supervision of low-risk pregnancy in third trimester (Primary) BP and FHR normal Doing well, feeling regular movement    2. Late prenatal care affecting pregnancy in second trimester   3. Grand multiparity with antenatal problem in second trimester   4. Language barrier In person interpreter present   5. [redacted] weeks gestation of pregnancy Anticipatory guidance  regarding upcoming appts   6. Vaginal irritation  - Cervicovaginal ancillary only - terconazole (TERAZOL 3) 0.8 % vaginal cream; Place 1 applicator vaginally at bedtime. Apply nightly for three nights.  Dispense: 20 g; Refill: 0  Preterm labor symptoms and general obstetric precautions including but not limited to vaginal bleeding, contractions, leaking of fluid and fetal movement were reviewed in detail with the patient. Please refer to After Visit Summary for other counseling recommendations.   Return in about 2 weeks (around 02/01/2024) for OB VISIT (MD or APP).   Albertine Grates, FNP

## 2024-01-21 ENCOUNTER — Telehealth: Payer: Self-pay | Admitting: Obstetrics and Gynecology

## 2024-01-21 LAB — CERVICOVAGINAL ANCILLARY ONLY
Bacterial Vaginitis (gardnerella): POSITIVE — AB
Candida Glabrata: NEGATIVE
Candida Vaginitis: NEGATIVE
Comment: NEGATIVE
Comment: NEGATIVE
Comment: NEGATIVE

## 2024-01-21 MED ORDER — METRONIDAZOLE 500 MG PO TABS
500.0000 mg | ORAL_TABLET | Freq: Two times a day (BID) | ORAL | 0 refills | Status: AC
Start: 2024-01-21 — End: 2024-01-28

## 2024-01-25 ENCOUNTER — Telehealth: Payer: Self-pay

## 2024-01-25 NOTE — Telephone Encounter (Signed)
 Patient called front office this morning regarding having runny nose, headaches, and sore throat. This RN called patient back with in house Spanish interpreter [place]. Patient endorses runny nose, headaches, sore throat and some SOB for the past 3 days and has not taken any medications. Patient states she has access to Mychart and RN will send list of medication that is safe to take during pregnancy, patient verbalized understanding. Patient reports that she has not been in contact with anyone recently who has had the flu or any other respiratory illness. Advised patient to go to the MAU if she is experiencing SOB to be further evaluated. Patient voiced understanding and MAU address provided to patient.   Marcelino Duster, RN

## 2024-01-25 NOTE — Telephone Encounter (Signed)
 Telephone call

## 2024-02-01 ENCOUNTER — Ambulatory Visit: Admitting: Medical

## 2024-02-01 ENCOUNTER — Other Ambulatory Visit: Payer: Self-pay

## 2024-02-01 ENCOUNTER — Encounter: Payer: Self-pay | Admitting: Medical

## 2024-02-01 VITALS — BP 99/61 | HR 66 | Wt 147.0 lb

## 2024-02-01 DIAGNOSIS — O0933 Supervision of pregnancy with insufficient antenatal care, third trimester: Secondary | ICD-10-CM

## 2024-02-01 DIAGNOSIS — Z603 Acculturation difficulty: Secondary | ICD-10-CM | POA: Diagnosis not present

## 2024-02-01 DIAGNOSIS — Z758 Other problems related to medical facilities and other health care: Secondary | ICD-10-CM

## 2024-02-01 DIAGNOSIS — Z3493 Encounter for supervision of normal pregnancy, unspecified, third trimester: Secondary | ICD-10-CM

## 2024-02-01 DIAGNOSIS — O0932 Supervision of pregnancy with insufficient antenatal care, second trimester: Secondary | ICD-10-CM

## 2024-02-01 DIAGNOSIS — Z3A33 33 weeks gestation of pregnancy: Secondary | ICD-10-CM | POA: Diagnosis not present

## 2024-02-01 DIAGNOSIS — O0943 Supervision of pregnancy with grand multiparity, third trimester: Secondary | ICD-10-CM

## 2024-02-01 NOTE — Progress Notes (Signed)
   PRENATAL VISIT NOTE  Subjective:  Haley Velasquez is a 33 y.o. G7P6006 at [redacted]w[redacted]d being seen today for ongoing prenatal care.  She is currently monitored for the following issues for this low-risk pregnancy and has Supervision of low-risk pregnancy; Grand multiparity with antenatal problem; Late prenatal care affecting pregnancy in second trimester; and Language barrier on their problem list.  Patient reports no complaints.  Contractions: Not present. Vag. Bleeding: None.  Movement: Present. Denies leaking of fluid.   The following portions of the patient's history were reviewed and updated as appropriate: allergies, current medications, past family history, past medical history, past social history, past surgical history and problem list.   Objective:   Vitals:   02/01/24 1040  BP: 99/61  Pulse: 66  Weight: 147 lb (66.7 kg)    Fetal Status: Fetal Heart Rate (bpm): 150 Fundal Height: 32 cm Movement: Present     General:  Alert, oriented and cooperative. Patient is in no acute distress.  Skin: Skin is warm and dry. No rash noted.   Cardiovascular: Normal heart rate noted  Respiratory: Normal respiratory effort, no problems with respiration noted  Abdomen: Soft, gravid, appropriate for gestational age.  Pain/Pressure: Absent     Pelvic: Cervical exam deferred        Extremities: Normal range of motion.  Edema: None  Mental Status: Normal mood and affect. Normal behavior. Normal judgment and thought content.   Assessment and Plan:  Pregnancy: G7P6006 at [redacted]w[redacted]d 1. Encounter for supervision of low-risk pregnancy in third trimester (Primary) - yeast symptoms have resolved with treatment   2. Grand multiparity with antenatal problem in third trimester  3. Language barrier - Interpreter used   4. Late prenatal care affecting pregnancy in second trimester  5. [redacted] weeks gestation of pregnancy   Preterm labor symptoms and general obstetric precautions including but not limited  to vaginal bleeding, contractions, leaking of fluid and fetal movement were reviewed in detail with the patient. Please refer to After Visit Summary for other counseling recommendations.   Return in about 2 weeks (around 02/15/2024) for LOB, any provider.  No future appointments.  Vonzella Nipple, PA-C

## 2024-02-25 ENCOUNTER — Ambulatory Visit: Admitting: Family Medicine

## 2024-02-25 ENCOUNTER — Other Ambulatory Visit (HOSPITAL_COMMUNITY)
Admission: RE | Admit: 2024-02-25 | Discharge: 2024-02-25 | Disposition: A | Discharge status: Home / Self Care | Source: Ambulatory Visit | Attending: Family Medicine | Admitting: Family Medicine

## 2024-02-25 ENCOUNTER — Other Ambulatory Visit: Payer: Self-pay

## 2024-02-25 VITALS — BP 105/69 | HR 65 | Wt 165.0 lb

## 2024-02-25 DIAGNOSIS — O0943 Supervision of pregnancy with grand multiparity, third trimester: Secondary | ICD-10-CM | POA: Diagnosis not present

## 2024-02-25 DIAGNOSIS — Z3A37 37 weeks gestation of pregnancy: Secondary | ICD-10-CM | POA: Diagnosis not present

## 2024-02-25 DIAGNOSIS — O0933 Supervision of pregnancy with insufficient antenatal care, third trimester: Secondary | ICD-10-CM | POA: Diagnosis not present

## 2024-02-25 DIAGNOSIS — O0932 Supervision of pregnancy with insufficient antenatal care, second trimester: Secondary | ICD-10-CM

## 2024-02-25 DIAGNOSIS — Z3493 Encounter for supervision of normal pregnancy, unspecified, third trimester: Secondary | ICD-10-CM

## 2024-02-25 DIAGNOSIS — Z603 Acculturation difficulty: Secondary | ICD-10-CM | POA: Diagnosis not present

## 2024-02-25 DIAGNOSIS — Z758 Other problems related to medical facilities and other health care: Secondary | ICD-10-CM

## 2024-02-26 LAB — GC/CHLAMYDIA PROBE AMP (~~LOC~~) NOT AT ARMC
Chlamydia: NEGATIVE
Comment: NEGATIVE
Comment: NORMAL
Neisseria Gonorrhea: NEGATIVE

## 2024-02-26 NOTE — Progress Notes (Signed)
   PRENATAL VISIT NOTE  Subjective:  Haley Velasquez is a 33 y.o. G7P6006 at [redacted]w[redacted]d being seen today for ongoing prenatal care.  She is currently monitored for the following issues for this high-risk pregnancy and has Supervision of low-risk pregnancy; Grand multiparity with antenatal problem; Late prenatal care affecting pregnancy in second trimester; and Language barrier on their problem list.  Patient reports no bleeding, no contractions, no cramping, and no leaking.  Contractions: Not present. Vag. Bleeding: None.  Movement: Present. Denies leaking of fluid.   The following portions of the patient's history were reviewed and updated as appropriate: allergies, current medications, past family history, past medical history, past social history, past surgical history and problem list.   Objective:   Vitals:   02/25/24 1148  BP: 105/69  Pulse: 65  Weight: 165 lb (74.8 kg)    Fetal Status: Fetal Heart Rate (bpm): 136   Movement: Present     General:  Alert, oriented and cooperative. Patient is in no acute distress.  Skin: Skin is warm and dry. No rash noted.   Cardiovascular: Normal heart rate noted  Respiratory: Normal respiratory effort, no problems with respiration noted  Abdomen: Soft, gravid, appropriate for gestational age.  Pain/Pressure: Absent     Pelvic: Cervical exam performed in the presence of a chaperone      1/thick/high  Extremities: Normal range of motion.  Edema: None  Mental Status: Normal mood and affect. Normal behavior. Normal judgment and thought content.   Assessment and Plan:  Pregnancy: G7P6006 at 100w1d 1. Encounter for supervision of low-risk pregnancy in third trimester (Primary) FHR BP appropriate today - Culture, beta strep (group b only) - GC/Chlamydia probe amp ([place])not at [facility]  2. Grand multiparity with antenatal problem in third trimester  3. Language barrier Spanish interpreter used for visit  4. Late prenatal care affecting  pregnancy in second trimester  5. [redacted] weeks gestation of pregnancy GBS and GC/chlamydia swabs collected today  Term labor symptoms and general obstetric precautions including but not limited to vaginal bleeding, contractions, leaking of fluid and fetal movement were reviewed in detail with the patient. Please refer to After Visit Summary for other counseling recommendations.   No follow-ups on file.  Future Appointments  Date Time Provider Department Center  03/03/2024  2:35 PM [name], Mardee Shackle, MD [facility]    Ferdie Housekeeper, MD

## 2024-02-29 LAB — CULTURE, BETA STREP (GROUP B ONLY): Strep Gp B Culture: NEGATIVE

## 2024-03-03 ENCOUNTER — Ambulatory Visit: Admitting: Family Medicine

## 2024-03-03 ENCOUNTER — Other Ambulatory Visit: Payer: Self-pay

## 2024-03-03 VITALS — BP 106/68 | HR 68 | Wt 164.0 lb

## 2024-03-03 DIAGNOSIS — O0932 Supervision of pregnancy with insufficient antenatal care, second trimester: Secondary | ICD-10-CM

## 2024-03-03 DIAGNOSIS — O0933 Supervision of pregnancy with insufficient antenatal care, third trimester: Secondary | ICD-10-CM | POA: Diagnosis not present

## 2024-03-03 DIAGNOSIS — Z603 Acculturation difficulty: Secondary | ICD-10-CM | POA: Diagnosis not present

## 2024-03-03 DIAGNOSIS — O0943 Supervision of pregnancy with grand multiparity, third trimester: Secondary | ICD-10-CM | POA: Diagnosis not present

## 2024-03-03 DIAGNOSIS — Z758 Other problems related to medical facilities and other health care: Secondary | ICD-10-CM

## 2024-03-03 DIAGNOSIS — Z3A38 38 weeks gestation of pregnancy: Secondary | ICD-10-CM

## 2024-03-03 DIAGNOSIS — Z3493 Encounter for supervision of normal pregnancy, unspecified, third trimester: Secondary | ICD-10-CM

## 2024-03-03 NOTE — Progress Notes (Signed)
   PRENATAL VISIT NOTE  Subjective:  Haley Velasquez is a 33 y.o. G7P6006 at [redacted]w[redacted]d being seen today for ongoing prenatal care.  She is currently monitored for the following issues for this low-risk pregnancy and has Supervision of low-risk pregnancy; Grand multiparity with antenatal problem; Late prenatal care affecting pregnancy in second trimester; and Language barrier on their problem list.  Patient reports no bleeding, no cramping, no leaking, and occasional contractions.  Contractions: Not present. Vag. Bleeding: None.  Movement: Present. Denies leaking of fluid.   The following portions of the patient's history were reviewed and updated as appropriate: allergies, current medications, past family history, past medical history, past social history, past surgical history and problem list.   Objective:   General: Alert, oriented and cooperative. Patient is in no acute distress.  Skin: Skin is warm and dry. No rash noted.   Cardiovascular: Normal heart rate noted  Respiratory: Normal respiratory effort, no problems with respiration noted  Abdomen: Soft, gravid, appropriate for gestational age.  Pain/Pressure: Absent     Pelvic: Cervical exam not performed  Extremities: Normal range of motion.  Edema: None  Mental Status: Normal mood and affect. Normal behavior. Normal judgment and thought content.   Assessment and Plan:  Pregnancy: G7P6006 at [redacted]w[redacted]d 1. Encounter for supervision of low-risk pregnancy in third trimester FHR BP appropriate today Continue routine prenatal care  2. Grand multiparity with antenatal problem in third trimester  3. Language barrier Spanish interpreter used  4. Late prenatal care affecting pregnancy in second trimester  5. [redacted] weeks gestation of pregnancy (Primary) Will need to be scheduled for induction of labor at next visit Continue cervical sweep at next visit  Term labor symptoms and general obstetric precautions including but not limited to  vaginal bleeding, contractions, leaking of fluid and fetal movement were reviewed in detail with the patient. Please refer to After Visit Summary for other counseling recommendations.   No follow-ups on file.  Future Appointments  Date Time Provider Department Center  03/18/2024  1:15 PM Jan Mcgill, MD [facility]    Ferdie Housekeeper, MD

## 2024-03-10 ENCOUNTER — Other Ambulatory Visit: Payer: Self-pay

## 2024-03-10 ENCOUNTER — Ambulatory Visit: Admitting: Family Medicine

## 2024-03-10 VITALS — BP 107/71 | HR 64 | Wt 167.8 lb

## 2024-03-10 DIAGNOSIS — O0933 Supervision of pregnancy with insufficient antenatal care, third trimester: Secondary | ICD-10-CM | POA: Diagnosis not present

## 2024-03-10 DIAGNOSIS — Z3493 Encounter for supervision of normal pregnancy, unspecified, third trimester: Secondary | ICD-10-CM

## 2024-03-10 DIAGNOSIS — Z3A39 39 weeks gestation of pregnancy: Secondary | ICD-10-CM | POA: Diagnosis not present

## 2024-03-10 DIAGNOSIS — Z603 Acculturation difficulty: Secondary | ICD-10-CM

## 2024-03-10 DIAGNOSIS — O0932 Supervision of pregnancy with insufficient antenatal care, second trimester: Secondary | ICD-10-CM

## 2024-03-10 DIAGNOSIS — O0943 Supervision of pregnancy with grand multiparity, third trimester: Secondary | ICD-10-CM | POA: Diagnosis not present

## 2024-03-10 DIAGNOSIS — Z758 Other problems related to medical facilities and other health care: Secondary | ICD-10-CM

## 2024-03-10 NOTE — Progress Notes (Addendum)
   PRENATAL VISIT NOTE  Subjective:  Haley Velasquez is a 33 y.o. G7P6006 at [redacted]w[redacted]d being seen today for ongoing prenatal care.  She is currently monitored for the following issues for this high-risk pregnancy and has Supervision of low-risk pregnancy; Grand multiparity with antenatal problem; Late prenatal care affecting pregnancy in second trimester; and Language barrier on their problem list.  Patient reports no bleeding, no cramping, and no leaking.  Contractions: Irritability. Vag. Bleeding: None.  Movement: Present. Denies leaking of fluid.   The following portions of the patient's history were reviewed and updated as appropriate: allergies, current medications, past family history, past medical history, past social history, past surgical history and problem list.   Objective:    Vitals:   03/10/24 0944  BP: 107/71  Pulse: 64  Weight: 167 lb 12.8 oz (76.1 kg)    Fetal Status:  Fetal Heart Rate (bpm): 148   Movement: Present    General: Alert, oriented and cooperative. Patient is in no acute distress.  Skin: Skin is warm and dry. No rash noted.   Cardiovascular: Normal heart rate noted  Respiratory: Normal respiratory effort, no problems with respiration noted  Abdomen: Soft, gravid, appropriate for gestational age.  Pain/Pressure: Present     Pelvic: Cervical exam performed in the presence of a chaperone      2.5/thick/-2  Extremities: Normal range of motion.  Edema: Trace  Mental Status: Normal mood and affect. Normal behavior. Normal judgment and thought content.   Assessment and Plan:  Pregnancy: G7P6006 at [redacted]w[redacted]d 1. Encounter for supervision of low-risk pregnancy in third trimester (Primary) FHR and BP appropriate today Membrane sweep today, patient tolerated procedure well Patient scheduled for 41-week induction Patient scheduled for 40-week BPP  2. Language barrier Spanish interpreter used for entire visit  3. Grand multiparity with antenatal problem in  third trimester  4. Late prenatal care affecting pregnancy in second trimester  5. [redacted] weeks gestation of pregnancy  Term labor symptoms and general obstetric precautions including but not limited to vaginal bleeding, contractions, leaking of fluid and fetal movement were reviewed in detail with the patient. Please refer to After Visit Summary for other counseling recommendations.   No follow-ups on file.  Future Appointments  Date Time Provider Department Center  03/18/2024 11:15 AM [facility] [facility]  03/18/2024  1:15 PM Jan Mcgill, MD [facility]  03/24/2024  6:30 AM [facility] ROOM [facility] None    Ferdie Housekeeper, MD

## 2024-03-13 ENCOUNTER — Other Ambulatory Visit: Payer: Self-pay | Admitting: *Deleted

## 2024-03-13 DIAGNOSIS — O480 Post-term pregnancy: Secondary | ICD-10-CM

## 2024-03-18 ENCOUNTER — Other Ambulatory Visit

## 2024-03-18 ENCOUNTER — Ambulatory Visit: Admitting: Obstetrics and Gynecology

## 2024-03-18 ENCOUNTER — Other Ambulatory Visit: Payer: Self-pay

## 2024-03-18 VITALS — BP 124/73 | HR 75 | Wt 168.3 lb

## 2024-03-18 DIAGNOSIS — Z3493 Encounter for supervision of normal pregnancy, unspecified, third trimester: Secondary | ICD-10-CM | POA: Diagnosis not present

## 2024-03-18 DIAGNOSIS — Z603 Acculturation difficulty: Secondary | ICD-10-CM | POA: Diagnosis not present

## 2024-03-18 DIAGNOSIS — Z3A40 40 weeks gestation of pregnancy: Secondary | ICD-10-CM | POA: Diagnosis not present

## 2024-03-18 DIAGNOSIS — Z758 Other problems related to medical facilities and other health care: Secondary | ICD-10-CM | POA: Diagnosis not present

## 2024-03-18 DIAGNOSIS — O0943 Supervision of pregnancy with grand multiparity, third trimester: Secondary | ICD-10-CM

## 2024-03-18 DIAGNOSIS — Z3009 Encounter for other general counseling and advice on contraception: Secondary | ICD-10-CM | POA: Insufficient documentation

## 2024-03-18 NOTE — Progress Notes (Signed)
   PRENATAL VISIT NOTE  Subjective:  Haley Velasquez is a 33 y.o. G7P6006 at [redacted]w[redacted]d being seen today for ongoing prenatal care.  She is currently monitored for the following issues for this high-risk pregnancy and has Supervision of low-risk pregnancy; Grand multiparity with antenatal problem; Late prenatal care affecting pregnancy in second trimester; Language barrier; and Unwanted fertility on their problem list.  Patient reports no complaints.  Contractions: Not present.  .  Movement: Present. Denies leaking of fluid.   The following portions of the patient's history were reviewed and updated as appropriate: allergies, current medications, past family history, past medical history, past social history, past surgical history and problem list.   Objective:    Vitals:   03/18/24 1321  BP: 124/73  Pulse: 75  Weight: 168 lb 4.8 oz (76.3 kg)   Fetal Status:  Fetal Heart Rate (bpm): 142   Movement: Present    General: Alert, oriented and cooperative. Patient is in no acute distress.  Skin: Skin is warm and dry. No rash noted.   Cardiovascular: Normal heart rate noted  Respiratory: Normal respiratory effort, no problems with respiration noted  Abdomen: Soft, gravid, appropriate for gestational age.  Pain/Pressure: Present (pressure in lower abdomen)   cephalic by palpation  Pelvic: Cervical exam deferred        Extremities: Normal range of motion.  Edema: Trace (bilateral feet)  Mental Status: Normal mood and affect. Normal behavior. Normal judgment and thought content.   Assessment and Plan:  Pregnancy: G7P6006 at [redacted]w[redacted]d  1. Encounter for supervision of low-risk pregnancy in third trimester (Primary) Has induction scheduled for 03/24/24 Missed BPP today, will reschedule post dates testing  2. Grand multiparity with antenatal problem in third trimester  3. Language barrier Spanish interpretor used  4. [redacted] weeks gestation of pregnancy  5. Unwanted fertility States she signed  papers when she was six months, not in chart No documentation of previous discussion Message sent to admin to see if they have not been scanned in yet Offered to resign papers for interval tubal but patient declines interval tubal   Term labor symptoms and general obstetric precautions including but not limited to vaginal bleeding, contractions, leaking of fluid and fetal movement were reviewed in detail with the patient. Please refer to After Visit Summary for other counseling recommendations.   Return for post partum check.  Future Appointments  Date Time Provider Department Center  03/18/2024  2:35 PM [facility] US2 [facility]  03/24/2024  6:30 AM [facility] SCHED ROOM [facility] None    Jan Mcgill, MD

## 2024-03-21 ENCOUNTER — Telehealth (HOSPITAL_COMMUNITY): Payer: Self-pay | Admitting: *Deleted

## 2024-03-21 ENCOUNTER — Encounter (HOSPITAL_COMMUNITY): Payer: Self-pay | Admitting: *Deleted

## 2024-03-21 NOTE — Telephone Encounter (Signed)
 [id number] interpreter number Preadmission screen

## 2024-03-22 ENCOUNTER — Encounter (HOSPITAL_COMMUNITY): Payer: Self-pay | Admitting: Obstetrics & Gynecology

## 2024-03-22 ENCOUNTER — Inpatient Hospital Stay (HOSPITAL_COMMUNITY)
Admission: AD | Admit: 2024-03-22 | Discharge: 2024-03-24 | DRG: 798 | Disposition: A | Discharge status: Home / Self Care | Attending: Obstetrics & Gynecology | Admitting: Obstetrics & Gynecology

## 2024-03-22 ENCOUNTER — Inpatient Hospital Stay (HOSPITAL_COMMUNITY): Admitting: Anesthesiology

## 2024-03-22 ENCOUNTER — Other Ambulatory Visit: Payer: Self-pay

## 2024-03-22 DIAGNOSIS — Z3009 Encounter for other general counseling and advice on contraception: Secondary | ICD-10-CM | POA: Diagnosis present

## 2024-03-22 DIAGNOSIS — Z833 Family history of diabetes mellitus: Secondary | ICD-10-CM | POA: Diagnosis not present

## 2024-03-22 DIAGNOSIS — O99214 Obesity complicating childbirth: Secondary | ICD-10-CM | POA: Diagnosis present

## 2024-03-22 DIAGNOSIS — Z302 Encounter for sterilization: Secondary | ICD-10-CM

## 2024-03-22 DIAGNOSIS — O9902 Anemia complicating childbirth: Principal | ICD-10-CM | POA: Diagnosis present

## 2024-03-22 DIAGNOSIS — O0940 Supervision of pregnancy with grand multiparity, unspecified trimester: Secondary | ICD-10-CM

## 2024-03-22 DIAGNOSIS — Z3A40 40 weeks gestation of pregnancy: Secondary | ICD-10-CM

## 2024-03-22 DIAGNOSIS — Z603 Acculturation difficulty: Secondary | ICD-10-CM | POA: Diagnosis present

## 2024-03-22 DIAGNOSIS — O26893 Other specified pregnancy related conditions, third trimester: Secondary | ICD-10-CM | POA: Diagnosis present

## 2024-03-22 DIAGNOSIS — Z758 Other problems related to medical facilities and other health care: Secondary | ICD-10-CM | POA: Diagnosis present

## 2024-03-22 DIAGNOSIS — O0932 Supervision of pregnancy with insufficient antenatal care, second trimester: Secondary | ICD-10-CM

## 2024-03-22 DIAGNOSIS — N939 Abnormal uterine and vaginal bleeding, unspecified: Secondary | ICD-10-CM

## 2024-03-22 DIAGNOSIS — Z3490 Encounter for supervision of normal pregnancy, unspecified, unspecified trimester: Secondary | ICD-10-CM

## 2024-03-22 DIAGNOSIS — O480 Post-term pregnancy: Secondary | ICD-10-CM

## 2024-03-22 LAB — CBC
HCT: 32.6 % — ABNORMAL LOW (ref 36.0–46.0)
Hemoglobin: 10.7 g/dL — ABNORMAL LOW (ref 12.0–15.0)
MCH: 27.8 pg (ref 26.0–34.0)
MCHC: 32.8 g/dL (ref 30.0–36.0)
MCV: 84.7 fL (ref 80.0–100.0)
Platelets: 280 10*3/uL (ref 150–400)
RBC: 3.85 MIL/uL — ABNORMAL LOW (ref 3.87–5.11)
RDW: 15.9 % — ABNORMAL HIGH (ref 11.5–15.5)
WBC: 7.6 10*3/uL (ref 4.0–10.5)
nRBC: 0 % (ref 0.0–0.2)

## 2024-03-22 LAB — COMPREHENSIVE METABOLIC PANEL WITH GFR
ALT: 10 U/L (ref 0–44)
AST: 22 U/L (ref 15–41)
Albumin: 2.8 g/dL — ABNORMAL LOW (ref 3.5–5.0)
Alkaline Phosphatase: 166 U/L — ABNORMAL HIGH (ref 38–126)
Anion gap: 10 (ref 5–15)
BUN: 9 mg/dL (ref 6–20)
CO2: 20 mmol/L — ABNORMAL LOW (ref 22–32)
Calcium: 8.7 mg/dL — ABNORMAL LOW (ref 8.9–10.3)
Chloride: 107 mmol/L (ref 98–111)
Creatinine, Ser: 0.46 mg/dL (ref 0.44–1.00)
GFR, Estimated: >60 mL/min (ref >60)
Glucose, Bld: 80 mg/dL (ref 70–99)
Potassium: 3.7 mmol/L (ref 3.5–5.1)
Sodium: 137 mmol/L (ref 135–145)
Total Bilirubin: 0.4 mg/dL (ref 0.0–1.2)
Total Protein: 6.5 g/dL (ref 6.5–8.1)

## 2024-03-22 LAB — WET PREP, GENITAL
Clue Cells Wet Prep HPF POC: NONE SEEN
Sperm: NONE SEEN
Trich, Wet Prep: NONE SEEN
WBC, Wet Prep HPF POC: >=10 — AB (ref <10)
Yeast Wet Prep HPF POC: NONE SEEN

## 2024-03-22 LAB — TYPE AND SCREEN
ABO/RH(D): O POS
Antibody Screen: NEGATIVE

## 2024-03-22 MED ORDER — BENZOCAINE-MENTHOL 20-0.5 % EX AERO
1.0000 | INHALATION_SPRAY | CUTANEOUS | Status: DC | PRN
Start: 2024-03-22 — End: 2024-03-24
  Filled 2024-03-22: qty 56

## 2024-03-22 MED ORDER — EPHEDRINE 5 MG/ML INJ: 10.0000 mg | INTRAVENOUS | Status: DC | PRN

## 2024-03-22 MED ORDER — OXYCODONE HCL 5 MG PO TABS
5.0000 mg | ORAL_TABLET | Freq: Four times a day (QID) | ORAL | Status: DC | PRN
  Administered 2024-03-24 (×2): 5 mg via ORAL
  Filled 2024-03-22 (×2): qty 1

## 2024-03-22 MED ORDER — OXYTOCIN BOLUS FROM INFUSION
333.0000 mL | Freq: Once | INTRAVENOUS | Status: AC
  Administered 2024-03-22: 333 mL via INTRAVENOUS

## 2024-03-22 MED ORDER — ACETAMINOPHEN 500 MG PO TABS
1000.0000 mg | ORAL_TABLET | Freq: Three times a day (TID) | ORAL | Status: DC
  Administered 2024-03-22 – 2024-03-24 (×6): 1000 mg via ORAL
  Filled 2024-03-22 (×6): qty 2

## 2024-03-22 MED ORDER — ZOLPIDEM TARTRATE 5 MG PO TABS: 5.0000 mg | ORAL_TABLET | Freq: Every evening | ORAL | Status: DC | PRN

## 2024-03-22 MED ORDER — DIBUCAINE (PERIANAL) 1 % EX OINT: 1.0000 | TOPICAL_OINTMENT | CUTANEOUS | Status: DC | PRN

## 2024-03-22 MED ORDER — PRENATAL MULTIVITAMIN CH
1.0000 | ORAL_TABLET | Freq: Every day | ORAL | Status: DC
Start: 2024-03-23 — End: 2024-03-24
  Administered 2024-03-24: 1 via ORAL
  Filled 2024-03-22: qty 1

## 2024-03-22 MED ORDER — METHYLERGONOVINE MALEATE 0.2 MG/ML IJ SOLN
0.2000 mg | Freq: Once | INTRAMUSCULAR | Status: AC
  Administered 2024-03-22: 0.2 mg via INTRAMUSCULAR

## 2024-03-22 MED ORDER — DIPHENHYDRAMINE HCL 50 MG/ML IJ SOLN: 12.5000 mg | INTRAMUSCULAR | Status: DC | PRN

## 2024-03-22 MED ORDER — SIMETHICONE 80 MG PO CHEW: 80.0000 mg | CHEWABLE_TABLET | ORAL | Status: DC | PRN

## 2024-03-22 MED ORDER — WITCH HAZEL-GLYCERIN EX PADS
1.0000 | MEDICATED_PAD | CUTANEOUS | Status: DC | PRN
Start: 2024-03-22 — End: 2024-03-24

## 2024-03-22 MED ORDER — MEDROXYPROGESTERONE ACETATE 150 MG/ML IM SUSP
150.0000 mg | INTRAMUSCULAR | Status: DC | PRN
Start: 2024-03-22 — End: 2024-03-24

## 2024-03-22 MED ORDER — ACETAMINOPHEN 325 MG PO TABS: 650.0000 mg | ORAL_TABLET | ORAL | Status: DC | PRN

## 2024-03-22 MED ORDER — ONDANSETRON HCL 4 MG/2ML IJ SOLN: 4.0000 mg | Freq: Four times a day (QID) | INTRAMUSCULAR | Status: DC | PRN

## 2024-03-22 MED ORDER — EPHEDRINE 5 MG/ML INJ
10.0000 mg | INTRAVENOUS | Status: DC | PRN
  Filled 2024-03-22: qty 5

## 2024-03-22 MED ORDER — TERBUTALINE SULFATE 1 MG/ML IJ SOLN: 0.2500 mg | Freq: Once | INTRAMUSCULAR | Status: DC | PRN

## 2024-03-22 MED ORDER — OXYCODONE-ACETAMINOPHEN 5-325 MG PO TABS: 1.0000 | ORAL_TABLET | ORAL | Status: DC | PRN

## 2024-03-22 MED ORDER — SOD CITRATE-CITRIC ACID 500-334 MG/5ML PO SOLN: 30.0000 mL | ORAL | Status: DC | PRN

## 2024-03-22 MED ORDER — FENTANYL CITRATE (PF) 100 MCG/2ML IJ SOLN
50.0000 ug | INTRAMUSCULAR | Status: DC | PRN
  Administered 2024-03-22: 100 ug via INTRAVENOUS
  Filled 2024-03-22: qty 2

## 2024-03-22 MED ORDER — OXYTOCIN-SODIUM CHLORIDE 30-0.9 UT/500ML-% IV SOLN
1.0000 m[IU]/min | INTRAVENOUS | Status: DC
  Administered 2024-03-22: 2 m[IU]/min via INTRAVENOUS
  Filled 2024-03-22: qty 500

## 2024-03-22 MED ORDER — IBUPROFEN 800 MG PO TABS
800.0000 mg | ORAL_TABLET | Freq: Three times a day (TID) | ORAL | Status: DC
  Administered 2024-03-22 – 2024-03-24 (×5): 800 mg via ORAL
  Filled 2024-03-22 (×6): qty 1

## 2024-03-22 MED ORDER — FENTANYL-BUPIVACAINE-NACL 0.5-0.125-0.9 MG/250ML-% EP SOLN
12.0000 mL/h | EPIDURAL | Status: DC | PRN
  Administered 2024-03-22: 12 mL/h via EPIDURAL
  Filled 2024-03-22: qty 250

## 2024-03-22 MED ORDER — LACTATED RINGERS IV SOLN
INTRAVENOUS | Status: DC
Start: 2024-03-22 — End: 2024-03-22

## 2024-03-22 MED ORDER — OXYCODONE-ACETAMINOPHEN 5-325 MG PO TABS: 2.0000 | ORAL_TABLET | ORAL | Status: DC | PRN

## 2024-03-22 MED ORDER — LACTATED RINGERS IV SOLN: 500.0000 mL | INTRAVENOUS | Status: DC | PRN

## 2024-03-22 MED ORDER — TRANEXAMIC ACID-NACL 1000-0.7 MG/100ML-% IV SOLN
1000.0000 mg | INTRAVENOUS | Status: AC
  Administered 2024-03-22: 1000 mg via INTRAVENOUS
  Filled 2024-03-22: qty 100

## 2024-03-22 MED ORDER — LIDOCAINE HCL (PF) 1 % IJ SOLN: 30.0000 mL | INTRAMUSCULAR | Status: DC | PRN

## 2024-03-22 MED ORDER — COCONUT OIL OIL
1.0000 | TOPICAL_OIL | Status: DC | PRN
Start: 2024-03-22 — End: 2024-03-24

## 2024-03-22 MED ORDER — METHYLERGONOVINE MALEATE 0.2 MG/ML IJ SOLN
INTRAMUSCULAR | Status: AC
  Filled 2024-03-22: qty 1

## 2024-03-22 MED ORDER — DIPHENHYDRAMINE HCL 25 MG PO CAPS: 25.0000 mg | ORAL_CAPSULE | Freq: Four times a day (QID) | ORAL | Status: DC | PRN

## 2024-03-22 MED ORDER — LIDOCAINE HCL (PF) 1 % IJ SOLN
INTRAMUSCULAR | Status: DC | PRN
Start: 2024-03-22 — End: 2024-03-22
  Administered 2024-03-22: 5 mL via EPIDURAL
  Administered 2024-03-22: 2 mL via EPIDURAL
  Administered 2024-03-22: 3 mL via EPIDURAL

## 2024-03-22 MED ORDER — PHENYLEPHRINE 80 MCG/ML (10ML) SYRINGE FOR IV PUSH (FOR BLOOD PRESSURE SUPPORT)
80.0000 ug | PREFILLED_SYRINGE | INTRAVENOUS | Status: DC | PRN
  Filled 2024-03-22: qty 10

## 2024-03-22 MED ORDER — ONDANSETRON HCL 4 MG/2ML IJ SOLN: 4.0000 mg | INTRAMUSCULAR | Status: DC | PRN

## 2024-03-22 MED ORDER — PHENYLEPHRINE 80 MCG/ML (10ML) SYRINGE FOR IV PUSH (FOR BLOOD PRESSURE SUPPORT): 80.0000 ug | PREFILLED_SYRINGE | INTRAVENOUS | Status: DC | PRN

## 2024-03-22 MED ORDER — LACTATED RINGERS IV SOLN
500.0000 mL | Freq: Once | INTRAVENOUS | Status: AC
  Administered 2024-03-22: 500 mL via INTRAVENOUS

## 2024-03-22 MED ORDER — OXYTOCIN-SODIUM CHLORIDE 30-0.9 UT/500ML-% IV SOLN
2.5000 [IU]/h | INTRAVENOUS | Status: DC
  Administered 2024-03-22: 2.5 [IU]/h via INTRAVENOUS

## 2024-03-22 MED ORDER — LACTATED RINGERS IV SOLN: 500.0000 mL | Freq: Once | INTRAVENOUS | Status: DC

## 2024-03-22 MED ORDER — METHYLERGONOVINE MALEATE 0.2 MG PO TABS
0.2000 mg | ORAL_TABLET | Freq: Four times a day (QID) | ORAL | Status: AC
Start: 2024-03-23 — End: 2024-03-23
  Administered 2024-03-23 (×2): 0.2 mg via ORAL
  Filled 2024-03-22 (×2): qty 1

## 2024-03-22 MED ORDER — ONDANSETRON HCL 4 MG PO TABS: 4.0000 mg | ORAL_TABLET | ORAL | Status: DC | PRN

## 2024-03-22 MED ORDER — OXYCODONE HCL 5 MG PO TABS: 10.0000 mg | ORAL_TABLET | Freq: Four times a day (QID) | ORAL | Status: DC | PRN

## 2024-03-22 MED ORDER — SENNOSIDES-DOCUSATE SODIUM 8.6-50 MG PO TABS
2.0000 | ORAL_TABLET | Freq: Every day | ORAL | Status: DC
Start: 2024-03-23 — End: 2024-03-24
  Administered 2024-03-24: 2 via ORAL
  Filled 2024-03-22: qty 2

## 2024-03-22 NOTE — H&P (Signed)
 OBSTETRIC ADMISSION HISTORY AND PHYSICAL  Haley Velasquez is a 33 y.o. female [phone] with IUP at [redacted]w[redacted]d by LMP presenting for post 40w elective IOL. She reports +FMs, No LOF, no VB, no blurry vision, headaches or peripheral edema, and RUQ pain.  She plans on breast and bottle feeding. She request BTL for birth control.  She received her prenatal care at [facility].   Dating: By LMP --->  Estimated Date of Delivery: 03/17/24  Sono:    @[redacted]w[redacted]d , CWD, normal anatomy, breech presentation, anterior placenta, 826g, 45% EFW   Prenatal History/Complications:  - Grand multiparity - Late prenatal care in second trimester - Language barrier  Past Medical History: Past Medical History:  Diagnosis Date   Medical history non-contributory    No pertinent past medical history     Past Surgical History: Past Surgical History:  Procedure Laterality Date   NO PAST SURGERIES      Obstetrical History: OB History     Gravida  7   Para  6   Term  6   Preterm  0   AB  0   Living  6      SAB  0   IAB  0   Ectopic  0   Multiple  0   Live Births  6           Social History Social History   Socioeconomic History   Marital status: Single    Spouse name: Not on file   Number of children: Not on file   Years of education: Not on file   Highest education level: Not on file  Occupational History   Not on file  Tobacco Use   Smoking status: Never    Passive exposure: Never   Smokeless tobacco: Never  Vaping Use   Vaping status: Never Used  Substance and Sexual Activity   Alcohol use: Not Currently   Drug use: No   Sexual activity: Not Currently    Partners: Male    Comment: 1st intercourse- 12, partners-  3, current partner- 5 yrs   Other Topics Concern   Not on file  Social History Narrative   Not on file   Social Drivers of Health   Financial Resource Strain: Not on File (02/09/2022)   Received from [employer] [employer]     Financial Resource Strain: 0  Food Insecurity: No Food Insecurity (10/10/2023)   Hunger Vital Sign    Worried About Running Out of Food in the Last Year: Never true    Ran Out of Food in the Last Year: Never true  Transportation Needs: No Transportation Needs (10/10/2023)   PRAPARE - Administrator, Civil Service (Medical): No    Lack of Transportation (Non-Medical): No  Physical Activity: Not on File (02/09/2022)   Received from [place], [place]   Physical Activity    Physical Activity: 0  Stress: Not on File (02/09/2022)   Received from [facility], [place]   Stress    Stress: 0  Social Connections: Not on File (07/07/2023)   Received from [facility]   Social Connections    Connectedness: 0    Family History: Family History  Problem Relation Age of Onset   Diabetes Mother    Diabetes Father    Asthma Neg Hx    Cancer Neg Hx    Heart disease Neg Hx    Hypertension Neg Hx     Allergies: No Known Allergies  Medications  Prior to Admission  Medication Sig Dispense Refill Last Dose/Taking   famotidine  (PEPCID ) 20 MG tablet Take 1 tablet (20 mg total) by mouth 2 (two) times daily. (Patient not taking: Reported on 03/18/2024) 60 tablet 1    Prenatal Vit-Fe Fumarate-FA (MULTIVITAMIN-PRENATAL) 27-0.8 MG TABS tablet Take 1 tablet by mouth daily at 12 noon.      terconazole  (TERAZOL 3 ) 0.8 % vaginal cream Place 1 applicator vaginally at bedtime. Apply nightly for three nights. (Patient not taking: Reported on 03/18/2024) 20 g 0      Review of Systems   All systems reviewed and negative except as stated in HPI  Blood pressure 127/70, pulse (!) 59, temperature 98 F (36.7 C), temperature source Oral, resp. rate 16, height 4\' 11"  (1.499 m), weight 75.6 kg, last menstrual period 06/11/2023, SpO2 99%. General appearance: alert and cooperative Lungs: clear to auscultation bilaterally Heart: regular rate and rhythm Abdomen: soft, non-tender; bowel sounds normal Extremities: Homans sign is  negative, no sign of DVT Presentation: cephalic Fetal monitoringBaseline: 135 bpm, Variability: Good {> 6 bpm), Accelerations: Reactive, and Decelerations: Absent Uterine activity: Uterine irritability Dilation: 3 Effacement (%): 50 Station: -3 Exam by:: Kayleen Party, CNM-Student   Prenatal labs: ABO, Rh: O/Positive/-- (12/18 1139) Antibody: Negative (12/18 1139) Rubella: 2.53 (12/18 1139) RPR: Non Reactive (03/14 0820)  HBsAg: Negative (12/18 1139)  HIV: Non Reactive (03/14 0820)  GBS: Negative/-- (05/05 1442)    Lab Results  Component Value Date   GBS Negative 02/25/2024   GTT normal Genetic screening  LR female, AFP neg Anatomy US  normal  Immunization History  Administered Date(s) Administered   Influenza Split 11/24/2012   Influenza, Seasonal, Injecte, Preservative Fre 11/05/2023   Tdap 11/23/2012, 01/04/2024    Prenatal Transfer Tool  Maternal Diabetes: No Genetic Screening: Normal Maternal Ultrasounds/Referrals: Normal Fetal Ultrasounds or other Referrals:  None Maternal Substance Abuse:  No Significant Maternal Medications:  None Significant Maternal Lab Results: Group B Strep negative Number of Prenatal Visits:greater than 3 verified prenatal visits Maternal Vaccinations:TDap and Covid Other Comments:  None   No results found for this or any previous visit (from the past 24 hours).  Patient Active Problem List   Diagnosis Date Noted   Encounter for induction of labor 03/22/2024   Unwanted fertility 03/18/2024   Late prenatal care affecting pregnancy in second trimester 11/05/2023   Language barrier 11/05/2023   Grand multiparity with antenatal problem 11/01/2023   Supervision of low-risk pregnancy 10/10/2023    Assessment/Plan:  Haley Velasquez is a 33 y.o. G7P6006 at [redacted]w[redacted]d here for elective IOL  #Labor: Will start with Pitocin  for IOL, discussed AROM at time of next check. #Pain: Per pt request #FWB: Cat I #GBS status:  negative #Feeding:  Breastmilk  and Formula #Reproductive Life planning: Tubal Ligation #Circ:  no  #Grand multip: TXA at delivery  Park Bolk, Medical Student  03/22/2024, 1:12 PM   Attestation of Supervision of Student:  I confirm that I have verified the information documented in the medical student's note and that I have also personally performed the history, physical exam and all medical decision making activities.  I have verified that all services and findings are accurately documented in this student's note; and I agree with management and plan as outlined in the documentation. I have also made any necessary editorial changes.   Melanie Spires, MD Center for [facility], [facility] 03/22/2024 5:47 PM

## 2024-03-22 NOTE — MAU Provider Note (Signed)
 MAU Provider Note  Chief Complaint: Vaginal Bleeding  SUBJECTIVE HPI: Haley Velasquez is a 33 y.o. [id number] at [redacted]w[redacted]d by midtrimester ultrasound (20 weeks) who presents to maternity admissions reporting painless vaginal bleeding since this morning when she wipes. 3 weeks ago in the office she was 3 cm. +FM.    Denies leakage of fluids. Baby moving well. Denies contractions, fever/chills, h/a, nausea, vomiting, dysuria, vaginal itching/burning.  Pregnancy c/b Spanish speaking, Grand Multip, Late to prenatal care.   Receives [facility] with [facility].   HPI  Past Medical History:  Diagnosis Date   Medical history non-contributory    No pertinent past medical history    Past Surgical History:  Procedure Laterality Date   NO PAST SURGERIES     Social History   Socioeconomic History   Marital status: Single    Spouse name: Not on file   Number of children: Not on file   Years of education: Not on file   Highest education level: Not on file  Occupational History   Not on file  Tobacco Use   Smoking status: Never    Passive exposure: Never   Smokeless tobacco: Never  Vaping Use   Vaping status: Never Used  Substance and Sexual Activity   Alcohol use: Not Currently   Drug use: No   Sexual activity: Not Currently    Partners: Male    Comment: 1st intercourse- 49, partners-  3, current partner- 5 yrs   Other Topics Concern   Not on file  Social History Narrative   Not on file   Social Drivers of Health   Financial Resource Strain: Not on File (02/09/2022)   Received from [employer] [employer]    Financial Resource Strain: 0  Food Insecurity: No Food Insecurity (10/10/2023)   Hunger Vital Sign    Worried About Running Out of Food in the Last Year: Never true    Ran Out of Food in the Last Year: Never true  Transportation Needs: No Transportation Needs (10/10/2023)   PRAPARE - Administrator, Civil Service (Medical): No    Lack of  Transportation (Non-Medical): No  Physical Activity: Not on File (02/09/2022)   Received from [place], [place]   Physical Activity    Physical Activity: 0  Stress: Not on File (02/09/2022)   Received from [facility], [place]   Stress    Stress: 0  Social Connections: Not on File (07/07/2023)   Received [facility]   Social Connections    Connectedness: 0  Intimate Partner Violence: Not on file   No current facility-administered medications on file prior to encounter.   Current Outpatient Medications on File Prior to Encounter  Medication Sig Dispense Refill   famotidine  (PEPCID ) 20 MG tablet Take 1 tablet (20 mg total) by mouth 2 (two) times daily. (Patient not taking: Reported on 03/18/2024) 60 tablet 1   Prenatal Vit-Fe Fumarate-FA (MULTIVITAMIN-PRENATAL) 27-0.8 MG TABS tablet Take 1 tablet by mouth daily at 12 noon.     terconazole  (TERAZOL 3 ) 0.8 % vaginal cream Place 1 applicator vaginally at bedtime. Apply nightly for three nights. (Patient not taking: Reported on 03/18/2024) 20 g 0   No Known Allergies  ROS:  Pertinent positives/negatives listed above.  I have reviewed patient's Past Medical Hx, Surgical Hx, Family Hx, Social Hx, medications and allergies.   Physical Exam  Patient Vitals for the past 24 hrs:  BP Temp Temp src Pulse Resp SpO2 Height Weight  03/22/24 1146  127/70 98 F (36.7 C) Oral (!) 59 16 99 % 4\' 11"  (1.499 m) 75.6 kg   Constitutional: Well-developed, well-nourished female in no acute distress  Cardiovascular: normal rate Respiratory: normal effort GI: Abd soft, non-tender MS: Extremities nontender, no edema, normal ROM Neurologic: Alert and oriented x 4  GU: Neg CVAT.  Dilation: 3 Effacement (%): 50 Station: -3 Presentation: Vertex Exam by:: Kayleen Party, CNM-Student  FHT:  Baseline  140, moderate variability, accelerations present, no decelerations Contractions: rare  LAB RESULTS No results found for this or any previous visit (from the past 24  hours).  O/Positive/-- (12/18 1139)  IMAGING US  Fetal BPP W/O Non Stress Result Date: 03/18/2024 ----------------------------------------------------------------------  OBSTETRICS REPORT                       (Signed Final 03/18/2024 05:14 pm) ---------------------------------------------------------------------- Patient Info  ID #:       [id number]                          D.O.B.:  [birth year] (33 yrs)(F)  Name:       Emmette Harms                 Visit Date: 03/18/2024 05:00 pm              GRANDE ---------------------------------------------------------------------- Performed By  Attending:        Dellie Fergusson MD         Ref. Address:     [address]                                                             [place], [place]                                                             [address]  Performed By:     Donnalee Gab         [facility]  Referred By:      [facility] ---------------------------------------------------------------------- Orders  #  Description                           Code        Ordered By  1  US  FETAL BPP WO NON STRESS            76819.0     KELLY DAVIS ----------------------------------------------------------------------  #  Order #                     Accession #                Episode #  1  [id number]                   [id number]                 [id number] ---------------------------------------------------------------------- Service(s) Provided  US  Fetal BPP W/O NST                                  [phone] ---------------------------------------------------------------------- Indications  [redacted] weeks gestation of pregnancy                Z3A.40  ---------------------------------------------------------------------- Fetal Evaluation  Num Of Fetuses:         1  Fetal Heart Rate(bpm):  143  Cardiac Activity:       Observed  Presentation:           Cephalic  AFI Sum(cm)     %Tile       Largest Pocket(cm)  7.43            8           2.85  RUQ(cm)       RLQ(cm)       LUQ(cm)        LLQ(cm)  2.1           1.43          1.05           2.85 ---------------------------------------------------------------------- Biophysical Evaluation  Amniotic F.V:   Pocket => 2 cm             F. Tone:        Observed  F. Movement:    Observed                   Score:          8/8  F. Breathing:   Observed ---------------------------------------------------------------------- OB History  Gravidity:    7         Term:   6        Prem:   0        SAB:   0  TOP:          0       Ectopic:  0        Living: 6 ---------------------------------------------------------------------- Gestational Age  LMP:           40w 1d        Date:  06/11/23                  EDD:   03/17/24  Best:  40w 1d     Det. By:  LMP  (06/11/23)          EDD:   03/17/24 ---------------------------------------------------------------------- Impression  BPP 8/8 with normal AFI, normal post dates testing. ---------------------------------------------------------------------- Recommendations  Keep induction as scheduled. ----------------------------------------------------------------------                   Dellie Fergusson, MD Electronically Signed Final Report   03/18/2024 05:14 pm ----------------------------------------------------------------------    MAU Management/MDM: Orders Placed This Encounter  Procedures   Wet prep, genital    Meds ordered this encounter  Medications   tranexamic acid (CYKLOKAPRON) IVPB 1,000 mg    Available prenatal records reviewed.  ASSESSMENT 1. [redacted] weeks gestation of pregnancy   2. Vaginal bleeding     PLAN Admit for induction given post dates with vaginal  bleeding L&D aware   Darrow End, MD FMOB Fellow, Faculty practice [facility]  03/22/2024  12:57 PM

## 2024-03-22 NOTE — Anesthesia Procedure Notes (Signed)
 Epidural Patient location during procedure: OB Start time: 03/22/2024 7:33 PM End time: 03/22/2024 7:40 PM  Staffing Anesthesiologist: Peggy Bowens, MD Performed: anesthesiologist   Preanesthetic Checklist Completed: patient identified, IV checked, site marked, risks and benefits discussed, surgical consent, monitors and equipment checked, pre-op evaluation and timeout performed  Epidural Patient position: sitting Prep: DuraPrep and site prepped and draped Patient monitoring: continuous pulse ox and blood pressure Approach: midline Location: L4-L5 Injection technique: LOR air  Needle:  Needle type: Tuohy  Needle gauge: 17 G Needle length: 9 cm and 9 Needle insertion depth: 6 cm Catheter type: closed end flexible Catheter size: 19 Gauge Catheter at skin depth: 12 (11-->12 when sat upright) cm Test dose: negative  Assessment Events: blood not aspirated, no cerebrospinal fluid, injection not painful, no injection resistance, no paresthesia and negative IV test

## 2024-03-22 NOTE — Discharge Summary (Incomplete)
 Postpartum Discharge Summary  Date of Service updated***     Patient Name: Haley Velasquez DOB: [birth year] MRN: [id number]  Date of admission: 03/22/2024 Delivery date:03/22/2024 Delivering provider: Maud Sorenson Date of discharge: 03/22/2024  Admitting diagnosis: Encounter for induction of labor [Z34.90] Intrauterine pregnancy: [redacted]w[redacted]d     Secondary diagnosis:  Principal Problem:   SVD (spontaneous vaginal delivery) Active Problems:   Supervision of low-risk pregnancy   Grand multiparity with antenatal problem   Late prenatal care affecting pregnancy in second trimester   Language barrier   Unwanted fertility  Additional problems: ***    Discharge diagnosis: Term Pregnancy Delivered and BTL                                              Post partum procedures:postpartum tubal ligation Augmentation: AROM and Pitocin  Complications: None  Hospital course: Induction of Labor With Vaginal Delivery   33 y.o. yo [id number] at [redacted]w[redacted]d was admitted to the hospital 03/22/2024 for induction of labor. Indication for induction: Elective.  Patient had an labor course complicated by none. Membrane Rupture Time/Date: 8:28 PM,03/22/2024  Delivery Method:Vaginal, Spontaneous Operative Delivery:N/A Episiotomy: None Lacerations:  Periurethral Details of delivery can be found in separate delivery note.  Patient had a postpartum course complicated by***. Patient is discharged home 03/22/24.  Newborn Data: Birth date:03/22/2024 Birth time:8:45 PM Gender:Female Living status:Living Apgars:7 ,9  Weight:   Magnesium Sulfate received: No BMZ received: No Rhophylac:No MMR:No T-DaP:Given prenatally Flu: N/A RSV Vaccine received: No Transfusion:{Transfusion received:30440034}  Immunizations received: Immunization History  Administered Date(s) Administered   Influenza Split 11/24/2012   Influenza, Seasonal, Injecte, Preservative Fre 11/05/2023   Tdap 11/23/2012, 01/04/2024     Physical exam  Vitals:   03/22/24 2045 03/22/24 2102 03/22/24 2110 03/22/24 2115  BP:  (!) 101/51 (!) 98/50 (!) 111/56  Pulse:  (!) 54 (!) 55 76  Resp:      Temp:      TempSrc:      SpO2: 100%  99% 99%  Weight:      Height:       General: {Exam; general:21111117} Lochia: {Desc; appropriate/inappropriate:30686::"appropriate"} Uterine Fundus: {Desc; firm/soft:30687} Incision: {Exam; incision:21111123} DVT Evaluation: {Exam; dvt:2111122} Labs: Lab Results  Component Value Date   WBC 7.6 03/22/2024   HGB 10.7 (L) 03/22/2024   HCT 32.6 (L) 03/22/2024   MCV 84.7 03/22/2024   PLT 280 03/22/2024      Latest Ref Rng & Units 03/22/2024    1:15 PM  CMP  Glucose 70 - 99 mg/dL 80   BUN 6 - 20 mg/dL 9   Creatinine 1.47 - 8.29 mg/dL 5.62   Sodium 130 - 865 mmol/L 137   Potassium 3.5 - 5.1 mmol/L 3.7   Chloride 98 - 111 mmol/L 107   CO2 22 - 32 mmol/L 20   Calcium 8.9 - 10.3 mg/dL 8.7   Total Protein 6.5 - 8.1 g/dL 6.5   Total Bilirubin 0.0 - 1.2 mg/dL 0.4   Alkaline Phos 38 - 126 U/L 166   AST 15 - 41 U/L 22   ALT 0 - 44 U/L 10    Edinburgh Score:     No data to display         No data recorded  After visit meds:  Allergies as of 03/22/2024   No Known Allergies  Med Rec must be completed prior to using this [facility]***        Discharge home in stable condition Infant Feeding: {Baby feeding:23562} Infant Disposition:{CHL IP OB HOME WITH MVHQIO:96295} Discharge instruction: per After Visit Summary and Postpartum booklet. Activity: Advance as tolerated. Pelvic rest for 6 weeks.  Diet: {OB MWUX:32440102} Future Appointments: Future Appointments  Date Time Provider Department Center  04/29/2024 10:35 AM Haley End, MD [facility]   Follow up Visit:  Message sent to [facility] 5/31  Please schedule this patient for a In person postpartum visit in 6 weeks with the following provider: Any provider. Additional Postpartum F/U:None  Low risk pregnancy  complicated by: grand multiparity Delivery mode:  Vaginal, Spontaneous Anticipated Birth Control:  BTL done [facility]   03/22/2024 Maud Sorenson, MD

## 2024-03-22 NOTE — Progress Notes (Signed)
 LABOR PROGRESS NOTE  Patient Name: Haley Velasquez, female   DOB: 1991/08/06, 33 y.o.  MRN: [id number]  In to meet patient. Now comfortable s/p epidural. ?SROM during epidural process. Amenable to check. Cervix 10/100/-1. R/B/A of AROM discussed with patient, and verbal consent obtained. Babe's head found to be well-applied. Obtained scant light meconium fluid. Mom and babe tolerated well.  Patient's preferred language is [place]. Engineer, water used during patient interaction.   Maud Sorenson, MD

## 2024-03-22 NOTE — Anesthesia Preprocedure Evaluation (Signed)
Anesthesia Evaluation  Patient identified by MRN, date of birth, ID band Patient awake    Reviewed: Allergy & Precautions, Patient's Chart, lab work & pertinent test results  Airway Mallampati: II  TM Distance: >3 FB     Dental   Pulmonary neg pulmonary ROS   Pulmonary exam normal        Cardiovascular negative cardio ROS Normal cardiovascular exam     Neuro/Psych negative neurological ROS     GI/Hepatic negative GI ROS, Neg liver ROS,,,  Endo/Other  negative endocrine ROS    Renal/GU negative Renal ROS     Musculoskeletal   Abdominal   Peds  Hematology negative hematology ROS (+)   Anesthesia Other Findings   Reproductive/Obstetrics (+) Pregnancy                             Anesthesia Physical Anesthesia Plan  ASA: 2  Anesthesia Plan: Epidural   Post-op Pain Management:    Induction:   PONV Risk Score and Plan: 2 and Treatment may vary due to age or medical condition  Airway Management Planned: Natural Airway  Additional Equipment:   Intra-op Plan:   Post-operative Plan:   Informed Consent: I have reviewed the patients History and Physical, chart, labs and discussed the procedure including the risks, benefits and alternatives for the proposed anesthesia with the patient or authorized representative who has indicated his/her understanding and acceptance.       Plan Discussed with:   Anesthesia Plan Comments:        Anesthesia Quick Evaluation

## 2024-03-22 NOTE — MAU Note (Signed)
 Haley Velasquez is a 33 y.o. at [redacted]w[redacted]d here in MAU reporting: since early this morning she has been having bleeding. No pain or contractions. Seeing when she wipes. 3 wks ago she was 3 cm.  Has not been checked since then.  No water leaking. Reports +FM. No problems with preg. Onset of complaint: this morning Pain score: none Vitals:   03/22/24 1146  BP: 127/70  Pulse: (!) 59  Resp: 16  Temp: 98 F (36.7 C)  SpO2: 99%     FHT:146 Lab orders placed from triage:

## 2024-03-23 ENCOUNTER — Inpatient Hospital Stay (HOSPITAL_COMMUNITY): Admitting: Certified Registered Nurse Anesthetist

## 2024-03-23 ENCOUNTER — Other Ambulatory Visit: Payer: Self-pay

## 2024-03-23 ENCOUNTER — Encounter (HOSPITAL_COMMUNITY)
Admission: AD | Disposition: A | Discharge status: Home / Self Care | Payer: Self-pay | Source: Home / Self Care | Attending: Obstetrics and Gynecology

## 2024-03-23 DIAGNOSIS — Z302 Encounter for sterilization: Secondary | ICD-10-CM

## 2024-03-23 HISTORY — PX: TUBAL LIGATION: SHX77

## 2024-03-23 LAB — RPR: RPR Ser Ql: NONREACTIVE

## 2024-03-23 SURGERY — LIGATION, FALLOPIAN TUBE, POSTPARTUM
Anesthesia: Epidural

## 2024-03-23 MED ORDER — LACTATED RINGERS IV SOLN: INTRAVENOUS | Status: AC

## 2024-03-23 MED ORDER — MIDAZOLAM HCL 2 MG/2ML IJ SOLN
INTRAMUSCULAR | Status: AC
  Filled 2024-03-23: qty 2

## 2024-03-23 MED ORDER — FENTANYL CITRATE (PF) 100 MCG/2ML IJ SOLN
INTRAMUSCULAR | Status: AC
  Filled 2024-03-23: qty 2

## 2024-03-23 MED ORDER — FENTANYL CITRATE (PF) 100 MCG/2ML IJ SOLN
INTRAMUSCULAR | Status: DC | PRN
  Administered 2024-03-23: 100 ug via EPIDURAL

## 2024-03-23 MED ORDER — LIDOCAINE-EPINEPHRINE (PF) 2 %-1:200000 IJ SOLN
INTRAMUSCULAR | Status: AC
  Filled 2024-03-23: qty 40

## 2024-03-23 MED ORDER — STERILE WATER FOR IRRIGATION IR SOLN
Status: DC | PRN
  Administered 2024-03-23: 1

## 2024-03-23 MED ORDER — BUPIVACAINE HCL (PF) 0.25 % IJ SOLN
INTRAMUSCULAR | Status: DC | PRN
  Administered 2024-03-23: 10 mL

## 2024-03-23 MED ORDER — METOCLOPRAMIDE HCL 10 MG PO TABS
10.0000 mg | ORAL_TABLET | Freq: Once | ORAL | Status: AC
  Administered 2024-03-23: 10 mg via ORAL
  Filled 2024-03-23: qty 1

## 2024-03-23 MED ORDER — LIDOCAINE-EPINEPHRINE (PF) 2 %-1:200000 IJ SOLN
INTRAMUSCULAR | Status: DC | PRN
  Administered 2024-03-23: 3 mL via EPIDURAL
  Administered 2024-03-23 (×2): 2 mL via EPIDURAL
  Administered 2024-03-23 (×3): 3 mL via EPIDURAL

## 2024-03-23 MED ORDER — MIDAZOLAM HCL 5 MG/5ML IJ SOLN
INTRAMUSCULAR | Status: DC | PRN
  Administered 2024-03-23 (×2): 1 mg via INTRAVENOUS

## 2024-03-23 MED ORDER — FENTANYL CITRATE (PF) 100 MCG/2ML IJ SOLN: INTRAMUSCULAR | Status: DC | PRN

## 2024-03-23 MED ORDER — FAMOTIDINE 20 MG PO TABS
40.0000 mg | ORAL_TABLET | Freq: Once | ORAL | Status: AC
  Administered 2024-03-23: 40 mg via ORAL
  Filled 2024-03-23: qty 2

## 2024-03-23 MED ORDER — BUPIVACAINE HCL (PF) 0.25 % IJ SOLN
INTRAMUSCULAR | Status: AC
Start: 2024-03-23 — End: ?
  Filled 2024-03-23: qty 10

## 2024-03-23 SURGICAL SUPPLY — 22 items
BLADE SURG 15 STRL LF C SS BP (BLADE) ×1 IMPLANT
CLOTH BEACON ORANGE TIMEOUT ST (SAFETY) ×1 IMPLANT
DERMABOND ADVANCED .7 DNX12 (GAUZE/BANDAGES/DRESSINGS) ×1 IMPLANT
DRSG OPSITE POSTOP 3X4 (GAUZE/BANDAGES/DRESSINGS) ×1 IMPLANT
ELECTRODE REM PT RTRN 9FT ADLT (ELECTROSURGICAL) IMPLANT
GLOVE BIOGEL PI IND STRL 7.0 (GLOVE) ×1 IMPLANT
GLOVE BIOGEL PI IND STRL 8 (GLOVE) ×1 IMPLANT
GLOVE ECLIPSE 8.0 STRL XLNG CF (GLOVE) ×1 IMPLANT
GOWN STRL REUS W/TWL LRG LVL3 (GOWN DISPOSABLE) ×2 IMPLANT
LIGASURE IMPACT 36 18CM CVD LR (INSTRUMENTS) IMPLANT
NEEDLE HYPO 22GX1.5 SAFETY (NEEDLE) IMPLANT
NS IRRIG 1000ML POUR BTL (IV SOLUTION) ×1 IMPLANT
PACK ABDOMINAL MINOR (CUSTOM PROCEDURE TRAY) ×1 IMPLANT
PENCIL BUTTON HOLSTER BLD 10FT (ELECTRODE) IMPLANT
PROTECTOR NERVE ULNAR (MISCELLANEOUS) ×1 IMPLANT
SPONGE LAP 4X18 RFD (DISPOSABLE) IMPLANT
SUT PLAIN ABS 2-0 CT1 27XMFL (SUTURE) ×2 IMPLANT
SUT VIC AB 0 CT1 27XBRD ANBCTR (SUTURE) ×1 IMPLANT
SUT VIC AB 4-0 KS 27 (SUTURE) ×1 IMPLANT
SYR CONTROL 10ML LL (SYRINGE) IMPLANT
TOWEL OR 17X24 6PK STRL BLUE (TOWEL DISPOSABLE) ×2 IMPLANT
TRAY FOLEY CATH SILVER 14FR (SET/KITS/TRAYS/PACK) ×1 IMPLANT

## 2024-03-23 NOTE — Lactation Note (Signed)
 This note was copied from a baby's chart. Lactation Consultation Note  Patient Name: Haley Velasquez [id number] Date: 03/23/2024 Age:33 hours Reason for consult: Initial assessment;Term  Spanish Interpreter used:   Per MOB,  she breastfeed all previous children but stopped at 3 months due to returning to work. Per MOB, infant breastfeed well in L&D for 20 minutes and was recently given 20 mls of formula at 23:15 pm . LC discussed with MOB to help establish her milk supply to latch infant first each feeding and then offer formula. MOB was given handout " Supplementing with Breastfeeding" MOB knows to breastfeed infant by cues, on demand, every 2-3 hours, skin to skin. LC discussed infant's input and output, Per MOB infant had 2 stools and one void since birth. MOB was  made aware of O/P services, breastfeeding support groups, community resources, and our phone # for post-discharge questions.    LC Faxed [facility] Referral form tonight.   MOB current feeding plan: 1- MOB will latch infant first every feeding, by cues, on demand, 8 to 12+ times, 2- MOB after latching infant at the breast her choice plans to supplement infant with formula. 3- MOB knows to call for latch assistance if needed.   Maternal Data Has patient been taught Hand Expression?: Yes Does the patient have breastfeeding experience prior to this delivery?: Yes How long did the patient breastfeed?: Per MOB, she breastfeed other previous children for 3 months stopped due to returning to work .  Feeding Mother's Current Feeding Choice: Breast Milk and Formula Nipple Type: Slow - flow  LATCH Score Latch: Grasps breast easily, tongue down, lips flanged, rhythmical sucking.  Audible Swallowing: A few with stimulation  Type of Nipple: Everted at rest and after stimulation  Comfort (Breast/Nipple): Soft / non-tender  Hold (Positioning): No assistance needed to correctly position infant at breast.  LATCH Score:  9   Lactation Tools Discussed/Used    Interventions Interventions: Breast feeding basics reviewed;Skin to skin;Education;CDC milk storage guidelines;CDC Guidelines for Breast Pump Cleaning;LC Services brochure;Guidelines for Milk Supply and Pumping Schedule Handout  Discharge Pump: Referral sent for [facility] Pump  Consult Status Consult Status: Follow-up Date: 03/23/24 Follow-up type: In-patient    Pecolia Bourbon 03/23/2024, 12:58 AM

## 2024-03-23 NOTE — Lactation Note (Addendum)
 This note was copied from a baby's chart. Lactation Consultation Note  Patient Name: Haley Velasquez [id number] Date: 03/23/2024 Age:33 hours  Reason for consult: Follow-up assessment;Term  P7, [redacted]w[redacted]d, Language barrier   W[facility] Spanish Interpreter, [place], present during visit.  Follow LC visit and mother is ready and awaiting to go to OR for her BTL today. She was given her stork pump and pleased to receive. Baby crying and fussy. Baby given to mother and she latched him with ease.   She denies any questions or concerns. Mother is breast and formula feeding. Encouraged to breastfeed baby first, when able, and supplement with formula afterwards.    Feeding Mother's Current Feeding Choice: Breast Milk and Formula Nipple Type: Slow - flow  LATCH Score Latch: Grasps breast easily, tongue down, lips flanged, rhythmical sucking.  Audible Swallowing: A few with stimulation  Type of Nipple: Everted at rest and after stimulation  Comfort (Breast/Nipple): Soft / non-tender  Hold (Positioning): No assistance needed to correctly position infant at breast.  LATCH Score: 9   Interventions Interventions: Breast feeding basics reviewed;Education  Discharge Pump: Received Stork Pump [employer] pump-Spectrum given to mother, left at bedside)  Consult Status Consult Status: Follow-up Date: 03/24/24 (mother getting ready for BTL/procedure, unable to review D/C education) Follow-up type: In-patient    Gearline Kell M 03/23/2024, 12:07 PM

## 2024-03-23 NOTE — Anesthesia Postprocedure Evaluation (Signed)
 Anesthesia Post Note  Patient: [facility]  Procedure(s) Performed: LIGATION, FALLOPIAN TUBE, POSTPARTUM     Patient location during evaluation: PACU Anesthesia Type: Epidural Level of consciousness: oriented and awake and alert Pain management: pain level controlled Vital Signs Assessment: post-procedure vital signs reviewed and stable Respiratory status: spontaneous breathing, respiratory function stable and nonlabored ventilation Cardiovascular status: blood pressure returned to baseline and stable Postop Assessment: no headache, no backache, no apparent nausea or vomiting, epidural receding and patient able to bend at knees Anesthetic complications: no   No notable events documented.  Last Vitals:  Vitals:   03/23/24 1500 03/23/24 1513  BP: 100/65 106/64  Pulse: (!) 54 (!) 52  Resp: (!) 22   Temp: 36.7 C 36.7 C  SpO2: 98% 97%    Last Pain:  Vitals:   03/23/24 1513  TempSrc: Oral  PainSc:    Pain Goal:    LLE Motor Response: Purposeful movement (03/23/24 1500) LLE Sensation: Tingling (03/23/24 1500) RLE Motor Response: Purposeful movement (03/23/24 1500) RLE Sensation: Tingling (03/23/24 1500)     Epidural/Spinal Function Cutaneous sensation: Tingles (03/23/24 1500), Patient able to flex knees: Yes (03/23/24 1500), Patient able to lift hips off bed: Yes (03/23/24 1500), Back pain beyond tenderness at insertion site: No (03/23/24 1500), Progressively worsening motor and/or sensory loss: No (03/23/24 1500), Bowel and/or bladder incontinence post epidural: No (03/23/24 1500)  Shylin Keizer A.

## 2024-03-23 NOTE — Op Note (Signed)
 Haley Velasquez [place]  03/23/2024  PREOPERATIVE DIAGNOSIS:  Multiparity, undesired fertility  POSTOPERATIVE DIAGNOSIS:  Multiparity, undesired fertility  PROCEDURE:  Postpartum Bilateral Tubal Salpingectomy  ANESTHESIA:  Epidural and local analgesia using 0.25% Marcaine  COMPLICATIONS:  None immediate.  ESTIMATED BLOOD LOSS: 6 ml.  TOTAL INTRAVENOUS FLUIDS: 150 ml.  TOTAL URINE OUTPUT: 300 ml.  INDICATIONS: 33 y.o. [id number]  with undesired fertility,status post vaginal delivery, desires permanent sterilization.  Other reversible forms of contraception were discussed with patient; she declines all other modalities. Risks of procedure discussed with patient including but not limited to: risk of regret, permanence of method, bleeding, infection, injury to surrounding organs and need for additional procedures.  Failure risk of 0.5-1% with increased risk of ectopic gestation if pregnancy occurs was also discussed with patient.     FINDINGS:  Normal uterus, tubes, and ovaries.  PROCEDURE DETAILS: The patient was taken to the operating room where her spinal anesthesia was dosed up to surgical level and found to be adequate.  She was then placed in a supine position and prepped and draped in the usual sterile fashion.  After an adequate timeout was performed, attention was turned to the patient's abdomen where a small transverse skin incision was made under the umbilical fold. The incision was taken down to the layer of fascia using the scalpel, and fascia was incised, and extended bilaterally. The peritoneum was entered in a sharp fashion. The patient was placed in Trendelenburg.  A moist lap pad was used to move omentum and bowel away until the right fallopian tube was identified and grasped with a Babcock clamp, and followed out to the fimbriated end. An avascular area in the mesosalpinx was identified and a window was created with Metzenbaum scissors. Ligasure device was used to cauterize from the  window to the proximal aspect of the fallopian tube. Ligasure then used to cauterize from the window distally to distal aspect of fallopian tube. A similar process was carried out on the right side allowing for bilateral tubal sterilization.  Good hemostasis was noted overall. The instruments were then removed from the patient's abdomen and the fascial incision was repaired with 0 Vicryl. Local anesthesia was injected into the subcutaneous tissue. The skin was closed with a 4-0 Vicryl subcuticular stitch. The patient tolerated the procedure well.  Sponge, lap, and needle counts were correct times two.  The patient was then taken to the recovery room awake and in stable condition.  [name] 03/23/2024 2:03 PM

## 2024-03-23 NOTE — Anesthesia Postprocedure Evaluation (Signed)
 Anesthesia Post Note  Patient: Haley Velasquez  Procedure(s) Performed: AN AD HOC LABOR EPIDURAL     Patient location during evaluation: Mother Baby Anesthesia Type: Epidural Level of consciousness: awake, awake and alert and oriented Pain management: satisfactory to patient Vital Signs Assessment: post-procedure vital signs reviewed and stable Respiratory status: spontaneous breathing, nonlabored ventilation and respiratory function stable Cardiovascular status: blood pressure returned to baseline and stable Postop Assessment: no headache, no backache, no apparent nausea or vomiting, able to ambulate, adequate PO intake and patient able to bend at knees Anesthetic complications: no   No notable events documented.  Last Vitals:  Vitals:   03/23/24 0339 03/23/24 0753  BP: (!) 92/50 106/60  Pulse: 62 (!) 59  Resp: 17 18  Temp: 36.7 C 36.6 C  SpO2: 99% 99%    Last Pain:  Vitals:   03/23/24 0753  TempSrc: Oral  PainSc: 0-No pain   Pain Goal:                Epidural/Spinal Function Cutaneous sensation: Normal sensation (03/23/24 0753), Patient able to flex knees: Yes (03/23/24 0753), Patient able to lift hips off bed: Yes (03/23/24 0753), Back pain beyond tenderness at insertion site: No (03/23/24 0753), Progressively worsening motor and/or sensory loss: No (03/23/24 0753), Bowel and/or bladder incontinence post epidural: No (03/23/24 0753)  [name]

## 2024-03-23 NOTE — Anesthesia Preprocedure Evaluation (Addendum)
 Anesthesia Evaluation  Patient identified by MRN, date of birth, ID band Patient awake    Reviewed: Allergy & Precautions, Patient's Chart, lab work & pertinent test results  Airway Mallampati: II  TM Distance: >3 FB     Dental no notable dental hx. (+) Teeth Intact, Dental Advisory Given   Pulmonary neg pulmonary ROS   Pulmonary exam normal breath sounds clear to auscultation       Cardiovascular negative cardio ROS Normal cardiovascular exam Rhythm:Regular Rate:Normal     Neuro/Psych negative neurological ROS  negative psych ROS   GI/Hepatic negative GI ROS, Neg liver ROS,,,  Endo/Other  Obesity  Renal/GU negative Renal ROS  negative genitourinary   Musculoskeletal negative musculoskeletal ROS (+)    Abdominal  (+) + obese  Peds  Hematology  (+) Blood dyscrasia, anemia   Anesthesia Other Findings   Reproductive/Obstetrics Desires sterilization post partum                              Anesthesia Physical Anesthesia Plan  ASA: 2  Anesthesia Plan: Epidural   Post-op Pain Management:    Induction:   PONV Risk Score and Plan: 4 or greater and Treatment may vary due to age or medical condition and Ondansetron   Airway Management Planned: Natural Airway  Additional Equipment: None  Intra-op Plan:   Post-operative Plan:   Informed Consent: I have reviewed the patients History and Physical, chart, labs and discussed the procedure including the risks, benefits and alternatives for the proposed anesthesia with the patient or authorized representative who has indicated his/her understanding and acceptance.     Dental advisory given and Interpreter used for interview  Plan Discussed with: Anesthesiologist and CRNA  Anesthesia Plan Comments:         Anesthesia Quick Evaluation

## 2024-03-23 NOTE — Transfer of Care (Signed)
 Immediate Anesthesia Transfer of Care Note  Patient: [name]  Procedure(s) Performed: LIGATION, FALLOPIAN TUBE, POSTPARTUM  Patient Location: PACU  Anesthesia Type:Epidural  Level of Consciousness: awake, alert , and oriented  Airway & Oxygen Therapy: Patient Spontanous Breathing  Post-op Assessment: Report given to RN and Post -op Vital signs reviewed and stable  Post vital signs: Reviewed and stable  Last Vitals:  Vitals Value Taken Time  BP 94/58 03/23/24 1406  Temp    Pulse 57 03/23/24 1408  Resp 19 03/23/24 1408  SpO2 96 % 03/23/24 1408  Vitals shown include unfiled device data.  Last Pain:  Vitals:   03/23/24 1206  TempSrc: Oral  PainSc:          Complications: No notable events documented.

## 2024-03-24 ENCOUNTER — Inpatient Hospital Stay (HOSPITAL_COMMUNITY)

## 2024-03-24 ENCOUNTER — Encounter (HOSPITAL_COMMUNITY): Payer: Self-pay | Admitting: Obstetrics & Gynecology

## 2024-03-24 ENCOUNTER — Inpatient Hospital Stay (HOSPITAL_COMMUNITY): Admission: RE | Admit: 2024-03-24 | Source: Home / Self Care | Admitting: Family Medicine

## 2024-03-24 ENCOUNTER — Ambulatory Visit: Payer: Self-pay | Admitting: Obstetrics and Gynecology

## 2024-03-24 LAB — GC/CHLAMYDIA PROBE AMP (~~LOC~~) NOT AT ARMC
Chlamydia: NEGATIVE
Comment: NEGATIVE
Comment: NORMAL
Neisseria Gonorrhea: NEGATIVE

## 2024-03-24 MED ORDER — IBUPROFEN 800 MG PO TABS: 800.0000 mg | ORAL_TABLET | Freq: Three times a day (TID) | ORAL | 0 refills | Status: DC

## 2024-03-24 NOTE — Lactation Note (Signed)
 This note was copied from a baby's chart. Lactation Consultation Note  Patient Name: Haley Velasquez [id number] Date: 03/24/2024 Age:33 hours Reason for consult: Follow-up assessment;Term  P7, Spanish interpreter used via Sport and exercise psychologist.  Mother is breastfeeding and offering formula after due to concerns about her milk supply. Discussed supply and demand and suggest offering the breast with each feeding to help establish her milk supply. Reassured mother and provided her with a manual pump. Reviewed engorgement care and monitoring voids/stools.  Maternal Data Has patient been taught Hand Expression?: Yes Does the patient have breastfeeding experience prior to this delivery?: Yes  Feeding Mother's Current Feeding Choice: Breast Milk and Formula  Interventions Interventions: Education;Hand pump  Discharge Discharge Education: Engorgement and breast care;Warning signs for feeding baby Pump: Personal;DEBP;Manual  Consult Status Consult Status: Complete Date: 03/24/24   Luellen Sages  RN, IBCLC 03/24/2024, 10:35 AM

## 2024-03-24 NOTE — Progress Notes (Signed)
 Ipad interpreter [phone] used to go over discharge paperwork.

## 2024-03-24 NOTE — Progress Notes (Signed)
 Ordered lunch and snack by Alexandra Ice Spanish Medical Interpreter.

## 2024-03-24 NOTE — Discharge Summary (Signed)
 Postpartum Discharge Summary     Patient Name: Haley Velasquez DOB: October 27, 1990 MRN: [id number]  Date of admission: 03/22/2024 Delivery date:03/22/2024 Delivering provider: Maud Sorenson Date of discharge: 03/24/2024  Admitting diagnosis: Encounter for induction of labor [Z34.90] Intrauterine pregnancy: [redacted]w[redacted]d     Secondary diagnosis:  Principal Problem:   SVD (spontaneous vaginal delivery) Active Problems:   Supervision of low-risk pregnancy   Grand multiparity with antenatal problem   Late prenatal care affecting pregnancy in second trimester   Language barrier   Unwanted fertility  Additional problems: None    Discharge diagnosis: Term Pregnancy Delivered and BTL                                              Post partum procedures:postpartum tubal ligation Augmentation: AROM and Pitocin  Complications: None  Hospital course: Induction of Labor With Vaginal Delivery   33 y.o. yo [id number] at [redacted]w[redacted]d was admitted to the hospital 03/22/2024 for induction of labor. Indication for induction: Elective.  Patient had an labor course complicated by none. Membrane Rupture Time/Date: 8:28 PM,03/22/2024  Delivery Method:Vaginal, Spontaneous Operative Delivery:N/A Episiotomy: None Lacerations:  Periurethral Details of delivery can be found in separate delivery note.  Patient had a postpartum course complicated by nothing. Patient is discharged home 03/24/24.  Newborn Data: Birth date:03/22/2024 Birth time:8:45 PM Gender:Female Living status:Living Apgars:7 ,9  Weight:3500 g  Magnesium Sulfate received: No BMZ received: No Rhophylac:No MMR:No T-DaP:Given prenatally Flu: N/A RSV Vaccine received: No Transfusion:No  Immunizations received: Immunization History  Administered Date(s) Administered   Influenza Split 11/24/2012   Influenza, Seasonal, Injecte, Preservative Fre 11/05/2023   Tdap 11/23/2012, 01/04/2024    Physical exam  Vitals:   03/23/24 1614 03/23/24 2150  03/24/24 0123 03/24/24 0535  BP: 110/68 112/65 112/67 121/61  Pulse: (!) 50 66 (!) 53 (!) 52  Resp: 16     Temp: 98.1 F (36.7 C) 98.2 F (36.8 C) 98.2 F (36.8 C) 98.1 F (36.7 C)  TempSrc: Oral Oral Oral Oral  SpO2: 98% 99% 99% 100%  Weight:      Height:       General: alert, cooperative, and no distress Lochia: appropriate Uterine Fundus: firm Incision: Healing well with no significant drainage, No significant erythema, Dressing is clean, dry, and intact DVT Evaluation: No evidence of DVT seen on physical exam. Negative Homan's sign. No cords or calf tenderness. No significant calf/ankle edema. Labs: Lab Results  Component Value Date   WBC 7.6 03/22/2024   HGB 10.7 (L) 03/22/2024   HCT 32.6 (L) 03/22/2024   MCV 84.7 03/22/2024   PLT 280 03/22/2024      Latest Ref Rng & Units 03/22/2024    1:15 PM  CMP  Glucose 70 - 99 mg/dL 80   BUN 6 - 20 mg/dL 9   Creatinine 1.47 - 8.29 mg/dL 5.62   Sodium 130 - 865 mmol/L 137   Potassium 3.5 - 5.1 mmol/L 3.7   Chloride 98 - 111 mmol/L 107   CO2 22 - 32 mmol/L 20   Calcium 8.9 - 10.3 mg/dL 8.7   Total Protein 6.5 - 8.1 g/dL 6.5   Total Bilirubin 0.0 - 1.2 mg/dL 0.4   Alkaline Phos 38 - 126 U/L 166   AST 15 - 41 U/L 22   ALT 0 - 44 U/L 10    Edinburgh Score:  03/24/2024   11:35 AM  Edinburgh Postnatal Depression Scale Screening Tool  I have been able to laugh and see the funny side of things. 0  I have looked forward with enjoyment to things. 0  I have blamed myself unnecessarily when things went wrong. 0  I have been anxious or worried for no good reason. 0  I have felt scared or panicky for no good reason. 1  Things have been getting on top of me. 0  I have been so unhappy that I have had difficulty sleeping. 0  I have felt sad or miserable. 0  I have been so unhappy that I have been crying. 0  The thought of harming myself has occurred to me. 0  Edinburgh Postnatal Depression Scale Total 1   Edinburgh Postnatal  Depression Scale Total: 1   After visit meds:  Allergies as of 03/24/2024   No Known Allergies      Medication List     STOP taking these medications    famotidine  20 MG tablet Commonly known as: Pepcid    terconazole  0.8 % vaginal cream Commonly known as: TERAZOL 3        TAKE these medications    ibuprofen  800 MG tablet Commonly known as: ADVIL  Take 1 tablet (800 mg total) by mouth every 8 (eight) hours.   multivitamin-prenatal 27-0.8 MG Tabs tablet Take 1 tablet by mouth daily at 12 noon.         Discharge home in stable condition Infant Feeding: Both Infant Disposition:home with mother Discharge instruction: per After Visit Summary and Postpartum booklet. Activity: Advance as tolerated. Pelvic rest for 6 weeks.  Diet: routine diet Future Appointments: Future Appointments  Date Time Provider Department Center  04/29/2024 10:35 AM D[place] End, MD [facility]   Follow up Visit:  Message sent to [facility] 5/31  Please schedule this patient for a In person postpartum visit in 6 weeks with the following provider: Any provider. Additional Postpartum F/U:None  Low risk pregnancy complicated by: grand multiparity Delivery mode:  Vaginal, Spontaneous Anticipated Birth Control:  BTL done [facility]   03/24/2024 Almond Army, CNM

## 2024-03-25 LAB — SURGICAL PATHOLOGY

## 2024-04-01 ENCOUNTER — Telehealth (HOSPITAL_COMMUNITY): Payer: Self-pay | Admitting: *Deleted

## 2024-04-01 NOTE — Telephone Encounter (Signed)
 04/01/2024  Name: Haley Velasquez MRN: [id number] DOB: 05/02/1991  Reason for Call:  Transition of Care Hospital Discharge Call  Contact Status: Patient Contact Status: Complete  Language assistant needed: Interpreter Mode: Telephonic Interpreter Interpreter Name: Rica Chalet [id number]        Follow-Up Questions: Do You Have Any Concerns About Your Health As You Heal From Delivery?: Yes What Concerns Do You Have About Your Health?: Patient reports bleeding and pain with urination.  Pain just started a couple of days ago.  Encouraged patient to call OB office and ask to be seen for evaluation of symptoms.  Patient also said that the doctor who performed her BTL told her that she would have two incisions, but she only sees one.  She removed the bandage from the incision near her umbilicus on the indicated day and she thinks the incision is healing well.  Advised that one incision at the umbilicus is typical for this procedure, and that she could ask for clarification about this at her office visit. Do You Have Any Concerns About Your Infants Health?: Yes What Concerns Do You Have About Your Baby?: Baby seems to be constipated.  LMB was 03/30/2024 in the evening.  Baby is eating normally, but seems uncomfortable. Discussed that she could try putting baby's knees to abdomen as if riding a bicycle, clockwise massage on baby's abdomen, or taking a rectal temperature to see if any of those things will stimulate a bowel movement.  Advised patient to call pediatrician if none of these things work.  Edinburgh Postnatal Depression Scale:  In the Past 7 Days: I have been able to laugh and see the funny side of things.: Not quite so much now I have looked forward with enjoyment to things.: Rather less than I used to I have blamed myself unnecessarily when things went wrong.: No, never I have been anxious or worried for no good reason.: No, not at all I have felt scared or panicky for no good reason.: No, not  at all Things have been getting on top of me.: No, I have been coping as well as ever I have been so unhappy that I have had difficulty sleeping.: Not at all I have felt sad or miserable.: No, not at all I have been so unhappy that I have been crying.: No, never The thought of harming myself has occurred to me.: Never Edinburgh Postnatal Depression Scale Total: 2  PHQ2-9 Depression Scale:     Discharge Follow-up: Edinburgh score requires follow up?: No  Post-discharge interventions: Reviewed Newborn Safe Sleep Practices  Pearlie Bougie, RN 04/01/2024 10:54

## 2024-04-04 ENCOUNTER — Other Ambulatory Visit (HOSPITAL_COMMUNITY)
Admission: RE | Admit: 2024-04-04 | Discharge: 2024-04-04 | Disposition: A | Discharge status: Home / Self Care | Source: Ambulatory Visit | Attending: Family Medicine | Admitting: Family Medicine

## 2024-04-04 ENCOUNTER — Ambulatory Visit: Admitting: *Deleted

## 2024-04-04 ENCOUNTER — Other Ambulatory Visit: Payer: Self-pay

## 2024-04-04 VITALS — BP 112/63 | HR 52 | Ht <= 58 in | Wt 149.2 lb

## 2024-04-04 DIAGNOSIS — R300 Dysuria: Secondary | ICD-10-CM | POA: Diagnosis not present

## 2024-04-04 DIAGNOSIS — N898 Other specified noninflammatory disorders of vagina: Secondary | ICD-10-CM | POA: Insufficient documentation

## 2024-04-04 DIAGNOSIS — R319 Hematuria, unspecified: Secondary | ICD-10-CM

## 2024-04-04 LAB — POCT URINALYSIS DIP (DEVICE)
Bilirubin Urine: NEGATIVE
Glucose, UA: NEGATIVE mg/dL
Ketones, ur: NEGATIVE mg/dL
Nitrite: NEGATIVE
Protein, ur: NEGATIVE mg/dL
Specific Gravity, Urine: 1.015 (ref 1.005–1.030)
Urobilinogen, UA: 0.2 mg/dL (ref 0.0–1.0)
pH: 6 (ref 5.0–8.0)

## 2024-04-04 NOTE — Progress Notes (Signed)
 Here for UA, c/o burning and pain with urination since Saturday and reports blood in urine once. Clean catch urine obtained and shows trace blood and leucocytes. Will send for culture. Explained if positive will contact her with treatment. She also c/o vaginal discharge and odor. Self swab collected. Explained will contact if positive. She also reports pain and feeling a poking feeling to right of her BTL incision and asked me to check incision. Incision CDI, No swelling, redness or edema noted at or near incision. Dr. Cresenzo in to assess wound and informed patient it is the suture knot and it will resolve within 6 weeks. Reviewed wound care. Patient voices understanding. Alejandra Hurst

## 2024-04-06 LAB — URINE CULTURE, OB REFLEX

## 2024-04-06 LAB — CULTURE, OB URINE

## 2024-04-07 ENCOUNTER — Telehealth: Payer: Self-pay | Admitting: *Deleted

## 2024-04-07 ENCOUNTER — Other Ambulatory Visit: Payer: Self-pay | Admitting: Family Medicine

## 2024-04-07 LAB — CERVICOVAGINAL ANCILLARY ONLY
Bacterial Vaginitis (gardnerella): POSITIVE — AB
Candida Glabrata: NEGATIVE
Candida Vaginitis: NEGATIVE
Chlamydia: NEGATIVE
Comment: NEGATIVE
Comment: NEGATIVE
Comment: NEGATIVE
Comment: NEGATIVE
Comment: NEGATIVE
Comment: NORMAL
Neisseria Gonorrhea: NEGATIVE
Trichomonas: POSITIVE — AB

## 2024-04-07 MED ORDER — METRONIDAZOLE 500 MG PO TABS: 500.0000 mg | ORAL_TABLET | Freq: Two times a day (BID) | ORAL | 0 refills | Status: DC

## 2024-04-07 NOTE — Telephone Encounter (Signed)
 Informed by Dr. [name] patient lab positive for Trichomoniasis and BV., RX sent in. I called patient with Interpreter [name] and left message we are calling re: results and medication, please call the office. Haley Velasquez

## 2024-04-07 NOTE — Progress Notes (Signed)
 Patient with positive wet prep for trichomonas and BV.  Flagyl  sent to patient's pharmacy.

## 2024-04-08 ENCOUNTER — Ambulatory Visit: Payer: Self-pay

## 2024-04-08 NOTE — Telephone Encounter (Signed)
 This encounter has been addressed during telephone visit on 04/08/24

## 2024-04-08 NOTE — Telephone Encounter (Addendum)
 Called patient with Spanish interpreter Ivin Marrow regarding patient's recent vaginal swab results. Notified patient treatment has been sent to her pharmacy and to complete entire dose of antibiotic as well as partner(s) should also be tested/ treated which can be done at [facility], urgent care or their PCP. Patient verbalized understanding and confirmed scheduled PP appointment on 04/29/24 at 1035. Patient denies any other needs at this time.  Moira Andrews, RN  ----- Message from [place] P sent at 04/08/2024  9:07 AM EDT ----- Regarding: FW: Results  ----- Message ----- From: Ferdie Housekeeper, MD Sent: 04/07/2024   1:55 PM EDT To: [facility] Admin Pool Subject: Results                                        Hey,  Looks like her visit for burning with urination is because she has trichomoniasis and bacterial vaginosis.  I will send the medication in but could someone use a Spanish interpreter and call her to  let her know about the results and that her partner needs to be treated as well.  Thanks!  Charlcie Conger ----- Message ----- From: Garner Jury Lab Results In Sent: 04/06/2024   7:36 AM EDT To: Ferdie Housekeeper, MD

## 2024-04-29 ENCOUNTER — Ambulatory Visit: Admitting: Obstetrics and Gynecology

## 2024-04-29 ENCOUNTER — Other Ambulatory Visit (HOSPITAL_COMMUNITY)
Admission: RE | Admit: 2024-04-29 | Discharge: 2024-04-29 | Disposition: A | Discharge status: Home / Self Care | Source: Ambulatory Visit | Attending: Obstetrics and Gynecology | Admitting: Obstetrics and Gynecology

## 2024-04-29 ENCOUNTER — Other Ambulatory Visit: Payer: Self-pay

## 2024-04-29 ENCOUNTER — Encounter: Payer: Self-pay | Admitting: Obstetrics and Gynecology

## 2024-04-29 DIAGNOSIS — N898 Other specified noninflammatory disorders of vagina: Secondary | ICD-10-CM

## 2024-04-29 DIAGNOSIS — A5901 Trichomonal vulvovaginitis: Secondary | ICD-10-CM | POA: Diagnosis not present

## 2024-04-29 DIAGNOSIS — R300 Dysuria: Secondary | ICD-10-CM | POA: Diagnosis not present

## 2024-04-29 DIAGNOSIS — Z603 Acculturation difficulty: Secondary | ICD-10-CM | POA: Diagnosis not present

## 2024-04-29 DIAGNOSIS — Z3009 Encounter for other general counseling and advice on contraception: Secondary | ICD-10-CM | POA: Diagnosis not present

## 2024-04-29 DIAGNOSIS — B3731 Acute candidiasis of vulva and vagina: Secondary | ICD-10-CM

## 2024-04-29 DIAGNOSIS — Z758 Other problems related to medical facilities and other health care: Secondary | ICD-10-CM

## 2024-04-29 DIAGNOSIS — Z9079 Acquired absence of other genital organ(s): Secondary | ICD-10-CM

## 2024-04-29 LAB — POCT URINALYSIS DIP (DEVICE)
Bilirubin Urine: NEGATIVE
Glucose, UA: NEGATIVE mg/dL
Hgb urine dipstick: NEGATIVE
Ketones, ur: NEGATIVE mg/dL
Nitrite: NEGATIVE
Protein, ur: 30 mg/dL — AB
Specific Gravity, Urine: 1.02 (ref 1.005–1.030)
Urobilinogen, UA: 0.2 mg/dL (ref 0.0–1.0)
pH: 7.5 (ref 5.0–8.0)

## 2024-04-29 NOTE — Progress Notes (Signed)
 Post Partum Visit Note  Haley Velasquez is a 33 y.o. [phone] female who presents for a postpartum visit. She is 5.3 weeks postpartum following a normal spontaneous vaginal delivery.  I have fully reviewed the prenatal and intrapartum course. The delivery was at 40.5 gestational weeks.  Anesthesia: epidural. Postpartum course has been okay. Baby is doing well. Baby is feeding by bottle - Similac Sensitive RS. Bleeding staining only. Bowel function is normal. Bladder function is normal. Patient is not sexually active. Contraception method is tubal ligation. Postpartum depression screening: negative.   The pregnancy intention screening data noted above was reviewed. Potential methods of contraception were discussed. The patient elected to proceed with No data recorded.   Edinburgh Postnatal Depression Scale - 04/29/24 1039       Edinburgh Postnatal Depression Scale:  In the Past 7 Days   I have been able to laugh and see the funny side of things. 0    I have looked forward with enjoyment to things. 0    I have blamed myself unnecessarily when things went wrong. 0    I have been anxious or worried for no good reason. 0    I have felt scared or panicky for no good reason. 0    Things have been getting on top of me. 0    I have been so unhappy that I have had difficulty sleeping. 0    I have felt sad or miserable. 0    I have been so unhappy that I have been crying. 0    The thought of harming myself has occurred to me. 0    Edinburgh Postnatal Depression Scale Total 0          Health Maintenance Due  Topic Date Due   Hepatitis B Vaccines (1 of 3 - 19+ 3-dose series) Never done   HPV VACCINES (1 - 3-dose SCDM series) Never done   COVID-19 Vaccine (1 - 2024-25 season) Never done    The following portions of the patient's history were reviewed and updated as appropriate: allergies, current medications, past family history, past medical history, past social history, past  surgical history, and problem list.  Review of Systems Pertinent items are noted in HPI.  Objective:  BP 118/77   Pulse 62   Wt 151 lb 9.6 oz (68.8 kg)   LMP 06/11/2023   Breastfeeding No   BMI 32.81 kg/m    General:  alert, cooperative, appears stated age, and no distress   Breasts:  normal  Lungs: Normal work of breathing, symmetric  Heart:  regular rate and rhythm  Abdomen: normal findings: soft, non-tender, umbilicus normal, and incision CDI well healed   Wound well approximated incision  GU exam:  normal       Assessment:   1. Postpartum state  2. Language barrier Spanish interpreter used for encounter  3. Unwanted fertility S/p bilateral salpingectomy  4. Trichomonal vaginitis TOC collected today, will follow up results. Still endorsing itching, burning.   Normal postpartum exam.   Plan:   Essential components of care per ACOG recommendations:  1.  Mood and well being: Patient with negative depression screening today. Reviewed local resources for support.  - Patient tobacco use? No.   - hx of drug use? No.    2. Infant care and feeding:  -Patient currently breastmilk feeding? No.  -Social determinants of health (SDOH) reviewed in EPIC. No concerns.  3. Sexuality, contraception and birth spacing - Patient does not  want a pregnancy in the next year.  Desired family size is 7 children.  - Reviewed reproductive life planning. Reviewed contraceptive methods based on pt preferences and effectiveness.  Patient s/p Female Sterilization.    4. Sleep and fatigue -Encouraged family/partner/community support of 4 hrs of uninterrupted sleep to help with mood and fatigue  5. Physical Recovery  - Discussed patients delivery and complications. She describes her labor as mixed. - Patient had a Vaginal, no problems at delivery. Patient had a right periurethral laceration. Perineal healing reviewed. Patient expressed understanding - Patient has urinary incontinence?  No. - Patient is safe to resume physical and sexual activity  6.  Health Maintenance - HM due items addressed Yes - Last pap smear 03/28/2022 NILM, HPV negative Diagnosis  Date Value Ref Range Status  06/10/2019   Final   NEGATIVE FOR INTRAEPITHELIAL LESIONS OR MALIGNANCY.   Pap smear not done at today's visit.  -Breast Cancer screening indicated? No.   7. Chronic Disease/Pregnancy Condition follow up: None  - PCP follow up  Mardy Shropshire, MD FMOB Fellow, Faculty practice [facility], Center for [facility]

## 2024-04-29 NOTE — Addendum Note (Signed)
 Addended by: LENNIE RONCO T on: 04/29/2024 11:34 AM   Modules accepted: Orders

## 2024-04-30 LAB — CERVICOVAGINAL ANCILLARY ONLY
Bacterial Vaginitis (gardnerella): NEGATIVE
Candida Glabrata: NEGATIVE
Candida Vaginitis: POSITIVE — AB
Chlamydia: NEGATIVE
Comment: NEGATIVE
Comment: NEGATIVE
Comment: NEGATIVE
Comment: NEGATIVE
Comment: NEGATIVE
Comment: NORMAL
Neisseria Gonorrhea: NEGATIVE
Trichomonas: NEGATIVE

## 2024-05-01 LAB — URINE CULTURE: Organism ID, Bacteria: NO GROWTH

## 2024-05-02 MED ORDER — FLUCONAZOLE 150 MG PO TABS: 150.0000 mg | ORAL_TABLET | Freq: Once | ORAL | 3 refills | Status: AC

## 2024-05-02 NOTE — Addendum Note (Signed)
 Addended by: Elverda Wendel R on: 05/02/2024 01:57 AM   Modules accepted: Orders

## 2024-08-25 NOTE — Progress Notes (Deleted)
   Cardiology Office Note    Date:  08/26/2024  ID:  Haley Velasquez, [place] 08-Jan-1991, MRN [id number] PCP:  Haley Suzen RAMAN, NP (Inactive)  Cardiologist:  None - New  Chief Complaint: ***  History of Present Illness: .    Haley Velasquez is a 33 y.o. female with visit-pertinent history of constipation seen for evaluation of bradycardia and dizziness at the request of Haley Olea, NP. She recently saw her PCP 07/14/24 at which time she reported having episodic dizziness and nausea. HR was 48 at that visit. No EKG available.***  EKG not done/needs this***  Sinus bradycardia Dizziness Nausea  Family History: Tobacco: Alcohol: Drug use:  Labwork independently reviewed: 06/2024 TSH and FT4 wnl, K 4.1, Cr 0.51, LFTs ok, CBC wnl, A1c 5.8, hcg neg  ROS: .    Please see the history of present illness. Otherwise, review of systems is positive for ***.  All other systems are reviewed and otherwise negative.  Studies Reviewed: SABRA    EKG:  EKG is ordered today, personally reviewed, demonstrating ***  CV Studies: Cardiac studies reviewed are outlined and summarized above. Otherwise please see EMR for full report.   Current Reported Medications:.    No outpatient medications have been marked as taking for the 08/26/24 encounter (Appointment) with Haley Lober N, PA-C.    Physical Exam:    VS:  There were no vitals taken for this visit.   Wt Readings from Last 3 Encounters:  04/29/24 151 lb 9.6 oz (68.8 kg)  04/04/24 149 lb 3.2 oz (67.7 kg)  03/22/24 166 lb 11.2 oz (75.6 kg)    GEN: Well nourished, well developed in no acute distress NECK: No JVD; No carotid bruits CARDIAC: ***RRR, no murmurs, rubs, gallops RESPIRATORY:  Clear to auscultation without rales, wheezing or rhonchi  ABDOMEN: Soft, non-tender, non-distended EXTREMITIES:  No edema; No acute deformity   Asessement and Plan:.     ***     Disposition: F/u with ***  ***[id number]***  Signed, Haley Velasquez  Haley Turton, PA-C

## 2024-08-26 ENCOUNTER — Ambulatory Visit: Attending: Physician Assistant | Admitting: Physician Assistant

## 2024-08-26 ENCOUNTER — Ambulatory Visit: Admitting: Physician Assistant

## 2024-08-26 DIAGNOSIS — R001 Bradycardia, unspecified: Secondary | ICD-10-CM

## 2024-08-26 DIAGNOSIS — R42 Dizziness and giddiness: Secondary | ICD-10-CM

## 2024-08-26 DIAGNOSIS — R110 Nausea: Secondary | ICD-10-CM

## 2024-09-10 ENCOUNTER — Ambulatory Visit: Attending: Nurse Practitioner

## 2024-09-10 ENCOUNTER — Ambulatory Visit: Attending: Nurse Practitioner | Admitting: Nurse Practitioner

## 2024-09-10 VITALS — BP 100/70 | HR 55 | Ht <= 58 in | Wt 148.6 lb

## 2024-09-10 DIAGNOSIS — R001 Bradycardia, unspecified: Secondary | ICD-10-CM | POA: Insufficient documentation

## 2024-09-10 DIAGNOSIS — I495 Sick sinus syndrome: Secondary | ICD-10-CM | POA: Diagnosis present

## 2024-09-10 DIAGNOSIS — R5383 Other fatigue: Secondary | ICD-10-CM

## 2024-09-10 DIAGNOSIS — R7303 Prediabetes: Secondary | ICD-10-CM

## 2024-09-10 DIAGNOSIS — R42 Dizziness and giddiness: Secondary | ICD-10-CM | POA: Diagnosis present

## 2024-09-10 NOTE — Progress Notes (Unsigned)
 Enrolled for [employer] to mail a ZIO XT long term holter monitor to the patients address on file.   Dr. Sheena to read.  Spanish instructions requested.

## 2024-09-10 NOTE — Progress Notes (Signed)
 Office Visit    Patient Name: Haley Velasquez Date of Encounter: 09/10/2024  Primary Care Provider:  Arloa Suzen RAMAN, NP (Inactive) Primary Cardiologist:  Kardie Tobb, DO  Chief Complaint    33 year old female with a history of bradycardia, dizziness, and prediabetes who presents for [facility] new patient evaluation.  Past Medical History    Past Medical History:  Diagnosis Date   No pertinent past medical history    Past Surgical History:  Procedure Laterality Date   TUBAL LIGATION N/A 03/23/2024   Procedure: LIGATION, FALLOPIAN TUBE, POSTPARTUM;  Surgeon: Jayne Vonn DEL, MD;  Location: [facility];  Service: Gynecology;  Laterality: N/A;    Allergies  No Known Allergies   Labs/Other Studies Reviewed    The following studies were reviewed today:     Recent Labs: 03/22/2024: ALT 10; BUN 9; Creatinine, Ser 0.46; Hemoglobin 10.7; Platelets 280; Potassium 3.7; Sodium 137  Recent Lipid Panel No results found for: CHOL, TRIG, HDL, CHOLHDL, VLDL, LDLCALC, LDLDIRECT  History of Present Illness    33 year old female with the above past medical history including bradycardia, dizziness, and prediabetes.  She delivered her seventh child in 03/2024. Postpartum course was uncomplicated.  She saw her PCP on 07/14/2024 for her annual physical exam and reported persistent dizziness, nausea, heart rate was 48 bpm.  She was referred to cardiology for further evaluation.  She presents today for [facility] clinic new patient evaluation, accompanied by in person Spanish interpreter.  She reports a 82-month history of daily dizziness, fatigue, intermittent nausea. She denies any palpitations, presyncope, syncope.  She also notes intermittent numbness and tingling to her arms and legs bilaterally.  This most often occurs when she is lying in bed at night.  She exercises on the weekends running and walking. She denies any chest pain or dyspnea. BP runs low. She  reports rare caffeine use, rare alcohol use. She works packing socks. She has 7 children ages 17 months to 62 years old.  Home Medications    No current outpatient medications on file.   No current facility-administered medications for this visit.     Review of Systems    She denies chest pain, palpitations, dyspnea, pnd, orthopnea, n, v, syncope, edema, weight gain, or early satiety. All other systems reviewed and are otherwise negative except as noted above.   Physical Exam    VS:  BP 100/70   Pulse (!) 55   Ht 4' 9 (1.448 m)   Wt 148 lb 9.6 oz (67.4 kg)   SpO2 100%   BMI 32.16 kg/m  GEN: Well nourished, well developed, in no acute distress. HEENT: normal. Neck: Supple, no JVD, carotid bruits, or masses. Cardiac: RRR, no murmurs, rubs, or gallops. No clubbing, cyanosis, edema.  Radials/DP/PT 2+ and equal bilaterally.  Respiratory:  Respirations regular and unlabored, clear to auscultation bilaterally. GI: Soft, nontender, nondistended, BS + x 4. MS: no deformity or atrophy. Skin: warm and dry, no rash. Neuro:  Strength and sensation are intact. Psych: Normal affect.  Accessory Clinical Findings    ECG personally reviewed by me today - EKG Interpretation Date/Time:  Wednesday September 10 2024 13:34:26 EST Ventricular Rate:  55 PR Interval:  140 QRS Duration:  82 QT Interval:  426 QTC Calculation: 407 R Axis:   34  Text Interpretation: Sinus bradycardia No previous ECGs available Confirmed by Daneen Perkins ([id number]) on 09/10/2024 1:35:08 PM  - no acute changes.   Lab Results  Component Value  Date   WBC 7.6 03/22/2024   HGB 10.7 (L) 03/22/2024   HCT 32.6 (L) 03/22/2024   MCV 84.7 03/22/2024   PLT 280 03/22/2024   Lab Results  Component Value Date   CREATININE 0.46 03/22/2024   BUN 9 03/22/2024   NA 137 03/22/2024   K 3.7 03/22/2024   CL 107 03/22/2024   CO2 20 (L) 03/22/2024   Lab Results  Component Value Date   ALT 10 03/22/2024   AST 22 03/22/2024    ALKPHOS 166 (H) 03/22/2024   BILITOT 0.4 03/22/2024   No results found for: CHOL, HDL, LDLCALC, LDLDIRECT, TRIG, CHOLHDL  Lab Results  Component Value Date   HGBA1C 5.6 10/10/2023    Assessment & Plan    1. Bradycardia/dizziness/generalized fatigue: She reports a 23-month history of daily dizziness, fatigue, intermittent nausea.  She denies any palpitations.  EKG today shows sinus bradycardia.  She denies any presyncope or syncope.  Denies symptoms concerning for angina.  Will check 14-day ZIO monitor, echocardiogram.  Recent labs including CBC, CMET, TSH, and vitamin D were stable. Reviewed ED precautions.  2. Prediabetes: A1c was 5.8 in 06/2024.  She reports intermittent numbness and tingling to her arms bilaterally, most often when she is lying in bed at night.  Will check B12.  Otherwise, recommend follow-up with PCP.  3. Disposition: Follow-up in 6 to 8 weeks with Dr. Sheena.    Damien JAYSON Braver, NP 09/15/2024, 4:19 PM

## 2024-09-10 NOTE — Patient Instructions (Addendum)
 Medication Instructions:  Your physician recommends that you continue on your current medications as directed. Please refer to the Current Medication list given to you today.  Instrucciones sobre la medicacin:  Su mdico le recomienda que contine con su medicacin actual segn las indicaciones. Consulte la lista de medicamentos que se le entreg hoy. *If you need a refill on your cardiac medications before your next appointment, please call your pharmacy*  Lab Work: VITAMIN B12  Trabajo de laboratorio:  vida b12   Testing/Procedures: Your physician has requested that you have an echocardiogram. Echocardiography is a painless test that uses sound waves to create images of your heart. It provides your doctor with information about the size and shape of your heart and how well your heart's chambers and valves are working. This procedure takes approximately one hour. There are no restrictions for this procedure. Please do NOT wear cologne, perfume, aftershave, or lotions (deodorant is allowed). Please arrive 15 minutes prior to your appointment time.  Please note: We ask at that you not bring children with you during ultrasound (echo/ vascular) testing. Due to room size and safety concerns, children are not allowed in the ultrasound rooms during exams. Our front office staff cannot provide observation of children in our lobby area while testing is being conducted. An adult accompanying a patient to their appointment will only be allowed in the ultrasound room at the discretion of the ultrasound technician under special circumstances. We apologize for any inconvenience.  Pruebas/Procedimientos: Su mdico le ha dentist. La ecocardiografa es una prueba indolora que utiliza ondas sonoras para crear imgenes de su corazn. Proporciona a su mdico informacin sobre el tamao y la forma de su corazn y el funcionamiento de sus cavidades y vlvulas. Este procedimiento dura  aproximadamente una hora. No hay restricciones para este procedimiento.  Por favor, NO use colonia, perfume, locin para despus del afeitado ni cremas (se permite desodorante).  Por favor, llegue 15 minutos antes de su cita.  Nota: Le pedimos que no traiga nios durante la ecografa (ecocardiografa/vascular). Debido al tamao de la sala y por motivos de seguridad, no se permite la entrada de nios a las salas de toll brothers. Nuestro personal de recepcin no puede supervisar a los nios en la sala de espera mientras se realizan las [place]. Un adulto que acompae a un paciente a su cita solo podr cytogeneticist a la sala de ecografa a discrecin del tcnico en circunstancias especiales. Disculpe las molestias.  ZIO XT- Long Term Monitor Instructions  Your physician has requested you wear a ZIO patch monitor for 14 days.  This is a single patch monitor. [employer] supplies one patch monitor per enrollment. Additional stickers are not available. Please do not apply patch if you will be having a Nuclear Stress Test,  Echocardiogram, Cardiac CT, MRI, or Chest Xray during the period you would be wearing the  monitor. The patch cannot be worn during these tests. You cannot remove and re-apply the  ZIO XT patch monitor.  Your ZIO patch monitor will be mailed 3 day USPS to your address on file. It may take 3-5 days  to receive your monitor after you have been enrolled.  Once you have received your monitor, please review the enclosed instructions. Your monitor  has already been registered assigning a specific monitor serial # to you.  Billing and Patient Assistance Program Information  We have supplied [employer] with any of your insurance information on file for billing purposes. [employer] offers  a sliding scale Patient Assistance Program for patients that do not have  insurance, or whose insurance does not completely cover the cost of the ZIO monitor.  You must apply for the Patient  Assistance Program to qualify for this discounted rate.  To apply, please call [employer] at [phone], select option 4, select option 2, ask to apply for  Patient Assistance Program. Meredeth will ask your household income, and how many people  are in your household. They will quote your out-of-pocket cost based on that information.  [employer] will also be able to set up a 7-month, interest-free payment plan if needed.  Applying the monitor   Shave hair from upper left chest.  Hold abrader disc by orange tab. Rub abrader in 40 strokes over the upper left chest as  indicated in your monitor instructions.  Clean area with 4 enclosed alcohol pads. Let dry.  Apply patch as indicated in monitor instructions. Patch will be placed under collarbone on left  side of chest with arrow pointing upward.  Rub patch adhesive wings for 2 minutes. Remove white label marked 1. Remove the white  label marked 2. Rub patch adhesive wings for 2 additional minutes.  While looking in a mirror, press and release button in center of patch. A small green light will  flash 3-4 times. This will be your only indicator that the monitor has been turned on.  Do not shower for the first 24 hours. You may shower after the first 24 hours.  Press the button if you feel a symptom. You will hear a small click. Record Date, Time and  Symptom in the Patient Logbook.  When you are ready to remove the patch, follow instructions on the last 2 pages of Patient  Logbook. Stick patch monitor onto the last page of Patient Logbook.  Place Patient Logbook in the blue and white box. Use locking tab on box and tape box closed  securely. The blue and white box has prepaid postage on it. Please place it in the mailbox as  soon as possible. Your physician should have your test results approximately 7 days after the  monitor has been mailed back to [facility].  Call [facility] Customer Care at [phone] if you have questions  regarding  your ZIO XT patch monitor. Call them immediately if you see an orange light blinking on your  monitor.  If your monitor falls off in less than 4 days, contact our Monitor department at [phone].  If your monitor becomes loose or falls off after 4 days call [employer] at [phone] for  suggestions on securing your monitor   Instrucciones para el monitor de larga duracin ZIO XT  Su mdico le ha solicitado que use un parche monitor ZIO durante [address]  Este es un parche monitor individual. [employer] proporciona un parche monitor por inscripcin. No hay parches adicionales disponibles.  No se dispone de parches adicionales. No se aplique el parche si se someter a una prueba de esfuerzo nuclear, un ecocardiograma, una tomografa computarizada cardaca, una resonancia magntica o una radiografa de trax durante el perodo en que usar el monitor.  El parche no se puede usar durante [address] No puede quitarse y volver a secondary school teacher parche monitor ZIO XT.  Su parche monitor ZIO se enviar por correo postal STAGE MANAGER) en 3 das a la direccin que consta en nuestros registros. Puede tardar de 3 a 5 das en recibirlo despus de su inscripcin.  Una vez que reciba su monitor,  revise las instrucciones adjuntas. Su monitor ya ha sido registrado y se le ha asignado un nmero de serie especfico.   Informacin sobre facturacin y Risk Analyst de Asistencia al Paciente  Hemos proporcionado a [employer] la informacin de su seguro que consta en nuestros archivos para fines de doctor, general practice.  [employer] ofrece un Programa de Asistencia al Paciente con tarifas ajustables segn los ingresos para pacientes que no tienen seguro o cuyo seguro no cubre completamente el costo del monitor ZIO.  Debe solicitar el Programa de Asistencia al Paciente para calificar para esta tarifa con descuento.  Para solicitarlo, llame a [employer] al [phone], seleccione la opcin 4, luego la opcin 2 y pregunte  cmo Programmer, Multimedia de Asistencia al [place]. [employer] le [employer] ingresos de su hogar y cuntas personas viven en l. Con base en esa informacin, le proporcionarn un presupuesto para el costo que deber pagar de su bolsillo.  Si lo necesita, [employer] tambin podr frontier [employer] corporation un plan de pago a 12 meses sin interesse.  Aplicacin del monitor  Afeite el vello de la parte superior izquierda del pecho.  Sujete el disco abrasivo por la pestaa naranja. Frote el disco engineer, materials con 40 pasadas sobre la parte superior izquierda del pecho, como se indica en las instrucciones del monitor.  Limpie la zona con las 4 toallitas con alcohol incluidas. Deje secar.  Aplique el parche como se indica en las instrucciones del monitor. El parche se colocar debajo de la clavcula, en el lado izquierdo del [place], con la flecha apuntando [place] arriba.  Frote las alas [place] del parche durante 2 minutos. Retire la etiqueta blanca marcada con el nmero 1. Retire la etiqueta blanca marcada con el nmero 2. Frote las alas deadra del parche durante 2 minutos ms.  Mirndose en un espejo, presione y suelte el botn en el centro del parche. Una pequea luz verde parpadear de 3 a 4 veces. Este ser el nico indicador de que el monitor se ha encendido.  No se duche durante las primeras 24 horas. Puede ducharse despus de las primeras 24 horas.  Presione el botn si siente algn sntoma. Oir un pequeo clic. Registre la fecha, la hora y  [place] en el Libro de Registro del [place].  Cuando est listo para quitarse el parche, siga las instrucciones de las ltimas 2 pginas del Libro de [place] del [place].  Pegue el monitor del parche en la ltima pgina del Libro de Registro del [place].  Coloque el Libro de Registro del Paciente en la caja azul y blanca. Use la pestaa de cierre de la caja y cirrela con cinta adhesiva. La caja azul y blanca tiene el franqueo prepagado. Por favor,  colquela en el buzn lo antes posible.  Su mdico recibir los [place] de la prueba aproximadamente 7 [employer] de que el monitor haya sido enviado de vuelta a [place].  Llame al [place] de [place] al [employer] al [phone] si tiene preguntas sobre su monitor de parche ZIO XT. Llmelos inmediatamente si ve una luz naranja parpadeando en su monitor.  Si su monitor se desprende en menos de [address], comunquese con [place] [employer] de Monitores al [phone]. Si su monitor se afloja o se cae despus de [address] llame a Meredeth al [phone] para obtener sugerencias sobre cmo asegurar su monitor  Follow-Up: At [facility], you and your health needs are our priority.  As part of our continuing mission to provide you with exceptional heart care,  our providers are all part of one team.  This team includes your primary Cardiologist (physician) and Advanced Practice Providers or APPs (Physician Assistants and Nurse Practitioners) who all work together to provide you with the care you need, when you need it.  Your next appointment:   6-8 week(s)  Provider:   Kardie Tobb, DO or Damien Braver, NP          We recommend signing up for the patient portal called MyChart.  Sign up information is provided on this After Visit Summary.  MyChart is used to connect with patients for Virtual Visits (Telemedicine).  Patients are able to view lab/test results, encounter notes, upcoming appointments, etc.  Non-urgent messages can be sent to your provider as well.   To learn more about what you can do with MyChart, go to [email or web address].   Other Instructions go to the pharmacy downstairs and get a blood pressure cuff  Ve a la farmacia de abajo y compra un tensimetro   Prevencin de la diabetes mellitus tipo 2 Preventing Type 2 Diabetes Mellitus La diabetes tipo 2, tambin llamada diabetes mellitus tipo 2, es una enfermedad a largo plazo (crnica) que  afecta los niveles de azcar (glucosa) en la [place]. Normalmente, una hormona denominada insulina permite el ingreso de la glucosa en las clulas del cuerpo. Las clulas usan la glucosa para psychiatrist. Las personas con diabetes tipo 2 pueden tener uno o ambos problemas descriptos a continuacin: El pncreas no produce la cantidad suficiente de insulina. Las clulas del cuerpo no responden de manera adecuada a la insulina que el organismo produce (resistencia a la insulina). La resistencia a la insulina o la falta de insulina hacen que el exceso de glucosa se acumule en la sangre, en lugar de ir a las clulas. Como resultado, aumenta la glucemia (hiperglucemia). Esto puede causar muchas complicaciones. Al tener sobrepeso o ser obeso y tener un estilo de vida inactivo (sedentario) puede aumentar su riesgo de padecer diabetes. La diabetes tipo 2 se puede retrasar o prevenir al realizar determinados cambios en la alimentacin y el estilo de vida. Cmo puede afectarme esta enfermedad? Si no toma medidas para prevenir la diabetes, los niveles de glucemia pueden continuar aumentando con el [place]. Un exceso de glucosa en la sangre durante mucho tiempo puede daar los vasos sanguneos, el corazn, los riones, los nervios y los ojos. La diabetes tipo 2 puede ocasionar problemas de salud crnicos y complicaciones, tales como: Enfermedad cardaca. Accidente cerebrovascular. Ceguera. Enfermedad renal. Depresin. Mala circulacin en los pies y las piernas. En los casos graves, puede ser necesario extirpar quirrgicamente (amputar) un pie o una pierna. Qu puede aumentar el riesgo? Puede ser ms probable que desarrolle diabetes tipo 2 si: Tiene casos de diabetes tipo 2 en su familia. Tiene sobrepeso u obesidad. Tiene un estilo de vida sedentario. Tiene resistencia a la insulina o antecedentes de prediabetes. Tiene antecedentes de diabetes relacionada con el embarazo (gestacional) o sndrome del ovario  poliqustico (SOP). Qu medidas de prevencin puedo tomar? Puede ser difcil reconocer los signos de la diabetes tipo 2. Tomar medidas para prevenir la enfermedad antes que desarrollar sntomas es la mejor manera de evitar posibles daos en el cuerpo. Realizar ciertos cambios en la alimentacin y el estilo de vida puede prevenir o retrasar la enfermedad y los problemas de salud relacionados. Nutricin  Consuma comidas y refrigerios saludables con regularidad. No omita comidas. Las frutas o un puado de frutos secos son ignacia colacin saludable entre las comidas. Renea  agua durante todo medical laboratory scientific officer. Evite los lquidos que contengan azcar agregada como las gaseosas o el t dulce. Beba suficiente lquido como para pharmacologist la orina de color amarillo plido. Siga las instrucciones del mdico respecto de las restricciones en las comidas o las bebidas. Limite la cantidad de alimentos que come: Controle la cantidad que come por vez (tamao de la porcin). Consulte las etiquetas de los alimentos para solicitor el tamao de la porcin correspondiente. Use una balanza de cocina para pesar las cantidades de alimentos. Saltee o hierva al vapor los alimentos en lugar de frerlos. Cocine con agua o caldo en lugar de aceites o manteca. Limite las grasas saturadas y la sal (sodio) en su alimentacin. No consuma ms de 1 cucharadita (2,400 mg) de sodio por da. Si tiene enfermedad cardaca o presin arterial alta, consuma menos de  a  cucharadita (1,500 mg) de sodio por da. Estilo de vida  Baje de peso si es necesario, tal como se lo hayan indicado. El mdico puede determinar cunto peso tiene que perder y restaurant manager, fast food a que adelgace de wellsite geologist segura. Si tiene sobrepeso o es obeso, es posible que le digan que pierda al menos entre el 5 y el 7 % de su peso corporal. Encrguese de la presin arterial, el colesterol y el estrs. Su mdico lo ayudar a determinar el mejor tratamiento para usted. No consuma ningn producto que  contenga nicotina o tabaco. Estos productos incluyen cigarrillos, tabaco para theatre manager y aparatos de vapeo, como los cigarrillos electrnicos. Si necesita ayuda para dejar de fumar, consulte al american express. Actividad  Practique actividad fsica que le acelere el ritmo cardaco y lo haga sudar (intensidad [place]). Haga keyspan, al menos, 30 minutos, como mnimo 5 [facility], o segn lo indicado por el mdico. Pregntele al mdico qu actividades son seguras para usted. Una combinacin de actividades puede ser la mejor opcin como, por ejemplo, caminar, practicar natacin, andar en bicicleta y hacer entrenamiento de fuerza. Trate de agregar actividad fsica a su jornada. Por ejemplo: Estacione el automvil en lugares ms alejados de lo habitual para tener que caminar ms. Vaya a caminar durante su hora de almuerzo. Utilice las microbiologist de ascensores o escaleras mecnicas. [place] o vaya en bicicleta al trabajo en lugar de conducir. Consumo de alcohol Si bebe alcohol: Limite la cantidad que bebe a lo siguiente: De 0 a 1 medida por da para las mujeres que no estn embarazadas. De 0 a 2 medidas por da para los hombres. Sepa cunta cantidad de alcohol hay en las bebidas que toma. En los [address], una medida equivale a una botella de cerveza de 12 oz (355 ml), un vaso de vino de 5 oz (148 ml) o un vaso de una bebida alcohlica de alta graduacin de 1 oz (44 ml). Informacin general Hable con el mdico acerca de todos los factores de riesgo y de cmo puede reducir el riesgo de desarrollar diabetes. Contrlese la glucemia de manera regular, segn lo indicado por su mdico. Somtase a pruebas de deteccin segn lo indicado por su mdico. Puede realizrselas con regularidad, especialmente si tiene ciertos factores de riesgo de padecer diabetes tipo 2. Programe una cita con un nutricionista matriculado. Este especialista en alimentacin y nutricin puede ayudarlo a crear un plan de  alimentacin saludable y a comprender los tamaos de las porciones y las etiquetas de los alimentos. Dnde buscar apoyo Pida a su mdico que le recomiende un nutricionista matriculado, un especialista en atencin y  educacin sobre la diabetes certificado o un programa para bajar de [place]. Busque grupos de apoyo para bajar de peso a nivel local o en lnea. Asista a un gimnasio, club de acondicionamiento fsico o nase a un grupo que realice actividad fsica al aire libre como un club de caminata. Dnde buscar ms informacin Para obtener ayuda y orientacin, as como ms informacin sobre la diabetes y la prevencin de la diabetes, visite: American Diabetes Association (ADA) (Asociacin Estadounidense de la Diabetes): www.diabetes.[employer] of Diabetes and Digestive and Kidney Diseases [employer] de la Diabetes y las Enfermedades Digestivas y Renales): carflippers.tn Para obtener ms informacin sobre alimentacin saludable, visite: U.S. Deparment of Agriculture ARCHITECT) (Departamento de Agricultura de los EE. UU.): https://ball-collins.biz/ Office of Disease Prevention and Health Promotion (ODPHP) (Oficina de Prevencin de [place] y Promocin de la Salud): researchname.uy Resumen Puede prevenir la diabetes tipo 2 o engineer, manufacturing su aparicin al comer alimentos saludables, bajar de peso si es necesario y aumentar su actividad fsica. Hable con el mdico acerca de sus factores de riesgo para desarrollar diabetes tipo 2 y de cmo puede reducir dicho riesgo. Puede ser difcil reconocer los signos de la diabetes tipo 2. La mejor manera de evitar posibles daos en el cuerpo es tomar medidas para prevenir la enfermedad antes que usted desarrolle los sntomas. Somtase a pruebas de deteccin segn lo indicado por su mdico. Esta informacin no tiene theme park manager el consejo del mdico. Asegrese de hacerle al mdico cualquier pregunta que tenga. Document Revised: 01/27/2021 Document  Reviewed: 01/27/2021 Elsevier Patient Education  2024 [employer].

## 2024-09-11 LAB — VITAMIN B12: Vitamin B-12: 544 pg/mL (ref 232–1245)

## 2024-09-15 ENCOUNTER — Encounter: Payer: Self-pay | Admitting: Nurse Practitioner

## 2024-09-22 ENCOUNTER — Ambulatory Visit: Payer: Self-pay | Admitting: Nurse Practitioner

## 2024-10-21 ENCOUNTER — Ambulatory Visit (HOSPITAL_COMMUNITY)
Admission: RE | Admit: 2024-10-21 | Discharge: 2024-10-21 | Disposition: A | Discharge status: Home / Self Care | Source: Ambulatory Visit | Attending: Nurse Practitioner | Admitting: Nurse Practitioner

## 2024-10-21 DIAGNOSIS — R001 Bradycardia, unspecified: Secondary | ICD-10-CM | POA: Insufficient documentation

## 2024-10-21 DIAGNOSIS — R5383 Other fatigue: Secondary | ICD-10-CM | POA: Diagnosis present

## 2024-10-21 DIAGNOSIS — R42 Dizziness and giddiness: Secondary | ICD-10-CM | POA: Insufficient documentation

## 2024-10-21 DIAGNOSIS — R55 Syncope and collapse: Secondary | ICD-10-CM | POA: Diagnosis not present

## 2024-10-21 LAB — ECHOCARDIOGRAM COMPLETE
Area-P 1/2: 4.17 cm2
S' Lateral: 3.3 cm

## 2024-11-06 ENCOUNTER — Encounter: Payer: Self-pay | Admitting: Nurse Practitioner

## 2024-11-06 ENCOUNTER — Ambulatory Visit: Attending: Nurse Practitioner | Admitting: Nurse Practitioner

## 2024-11-06 VITALS — BP 113/64 | HR 60 | Ht <= 58 in | Wt 145.0 lb

## 2024-11-06 DIAGNOSIS — R7303 Prediabetes: Secondary | ICD-10-CM | POA: Diagnosis not present

## 2024-11-06 DIAGNOSIS — R42 Dizziness and giddiness: Secondary | ICD-10-CM | POA: Insufficient documentation

## 2024-11-06 DIAGNOSIS — R001 Bradycardia, unspecified: Secondary | ICD-10-CM | POA: Diagnosis not present

## 2024-11-06 NOTE — Progress Notes (Signed)
 "  Office Visit    Patient Name: Haley Velasquez Date of Encounter: 11/06/2024  Primary Care Provider:  Pcp, No Primary Cardiologist:  Kardie Tobb, DO  Chief Complaint    34 year old female with a history of bradycardia, dizziness, and prediabetes who presents for follow-up related to dizziness and bradycardia.  Past Medical History    Past Medical History:  Diagnosis Date   No pertinent past medical history    Past Surgical History:  Procedure Laterality Date   TUBAL LIGATION N/A 03/23/2024   Procedure: LIGATION, FALLOPIAN TUBE, POSTPARTUM;  Surgeon: Jayne Vonn DEL, MD;  Location: [facility];  Service: Gynecology;  Laterality: N/A;    Allergies  Allergies[1]   Labs/Other Studies Reviewed    The following studies were reviewed today:  Cardiac Studies & Procedures   ______________________________________________________________________________________________     ECHOCARDIOGRAM  ECHOCARDIOGRAM COMPLETE 10/21/2024  Narrative ECHOCARDIOGRAM REPORT    Patient Name:   Haley Velasquez Date of Exam: 10/21/2024 Medical Rec #:  [id number]              Height:       57.0 in Accession #:    [id number]             Weight:       148.6 lb Date of Birth:  1991-02-23               BSA:          1.585 m Patient Age:    33 years               BP:           100/70 mmHg Patient Gender: F                      HR:           55 bpm. Exam Location:  [address]  Procedure: 2D Echo, Cardiac Doppler and Color Doppler (Both Spectral and Color Flow Doppler were utilized during procedure).  Indications:    R55 Syncope  History:        Patient has no prior history of Echocardiogram examinations. Arrythmias:Bradycardia; Signs/Symptoms:Syncope, Fatigue and Dizziness/Lightheadedness.  Sonographer:    Elsie Bohr RDCS Referring Phys: [phone] Naelle Diegel C Holman Bonsignore  IMPRESSIONS   1. Left ventricular ejection fraction, by estimation, is 60 to 65%. The left ventricle has normal  function. The left ventricle has no regional wall motion abnormalities. Left ventricular diastolic parameters were normal. 2. Right ventricular systolic function is normal. The right ventricular size is normal. There is normal pulmonary artery systolic pressure. 3. The mitral valve is normal in structure. Trivial mitral valve regurgitation. No evidence of mitral stenosis. 4. The aortic valve is tricuspid. Aortic valve regurgitation is not visualized. No aortic stenosis is present. 5. The inferior vena cava is normal in size with greater than 50% respiratory variability, suggesting right atrial pressure of 3 mmHg.  Comparison(s): No prior Echocardiogram.  Conclusion(s)/Recommendation(s): Normal biventricular function without evidence of hemodynamically significant valvular heart disease.  FINDINGS Left Ventricle: Left ventricular ejection fraction, by estimation, is 60 to 65%. The left ventricle has normal function. The left ventricle has no regional wall motion abnormalities. The left ventricular internal cavity size was normal in size. There is no left ventricular hypertrophy. Left ventricular diastolic parameters were normal.  Right Ventricle: The right ventricular size is normal. No increase in right ventricular wall thickness. Right ventricular systolic function is normal. There is normal pulmonary  artery systolic pressure. The tricuspid regurgitant velocity is 1.99 m/s, and with an assumed right atrial pressure of 3 mmHg, the estimated right ventricular systolic pressure is 18.8 mmHg.  Left Atrium: Left atrial size was normal in size.  Right Atrium: Right atrial size was normal in size.  Pericardium: There is no evidence of pericardial effusion.  Mitral Valve: The mitral valve is normal in structure. Trivial mitral valve regurgitation. No evidence of mitral valve stenosis.  Tricuspid Valve: The tricuspid valve is normal in structure. Tricuspid valve regurgitation is not demonstrated. No  evidence of tricuspid stenosis.  Aortic Valve: The aortic valve is tricuspid. Aortic valve regurgitation is not visualized. No aortic stenosis is present.  Pulmonic Valve: The pulmonic valve was grossly normal. Pulmonic valve regurgitation is trivial. No evidence of pulmonic stenosis.  Aorta: The aortic root, ascending aorta, aortic arch and descending aorta are all structurally normal, with no evidence of dilitation or obstruction.  Venous: The inferior vena cava is normal in size with greater than 50% respiratory variability, suggesting right atrial pressure of 3 mmHg.  IAS/Shunts: No atrial level shunt detected by color flow Doppler.   LEFT VENTRICLE PLAX 2D LVIDd:         5.20 cm   Diastology LVIDs:         3.30 cm   LV e' medial:    7.72 cm/s LV PW:         0.70 cm   LV E/e' medial:  11.3 LV IVS:        0.80 cm   LV e' lateral:   13.40 cm/s LVOT diam:     1.70 cm   LV E/e' lateral: 6.5 LV SV:         52 LV SV Index:   33 LVOT Area:     2.27 cm LV IVRT:       108 msec   RIGHT VENTRICLE             IVC RV S prime:     10.70 cm/s  IVC diam: 1.10 cm TAPSE (M-mode): 2.3 cm RVSP:           18.8 mmHg   PULMONARY VEINS Diastolic Velocity: 51.30 cm/s S/D Velocity:       0.90 Systolic Velocity:  46.20 cm/s  LEFT ATRIUM             Index        RIGHT ATRIUM           Index LA diam:        3.40 cm 2.14 cm/m   RA Pressure: 3.00 mmHg LA Vol (A2C):   35.2 ml 22.20 ml/m  RA Area:     15.20 cm LA Vol (A4C):   36.0 ml 22.71 ml/m  RA Volume:   39.40 ml  24.85 ml/m LA Biplane Vol: 38.1 ml 24.03 ml/m AORTIC VALVE LVOT Vmax:   94.20 cm/s LVOT Vmean:  65.300 cm/s LVOT VTI:    0.227 m  AORTA Ao Root diam: 2.90 cm Ao Asc diam:  2.80 cm  MITRAL VALVE               TRICUSPID VALVE MV Area (PHT): 4.17 cm    TR Peak grad:   15.8 mmHg MV Decel Time: 182 msec    TR Vmax:        199.00 cm/s MV E velocity: 87.00 cm/s  Estimated RAP:  3.00 mmHg MV A velocity: 50.80 cm/s  RVSP:  18.8 mmHg MV E/A ratio:  1.71 SHUNTS Systemic VTI:  0.23 m Systemic Diam: 1.70 cm  Shelda Bruckner MD Electronically signed by Shelda Bruckner MD Signature Date/Time: 10/21/2024/2:16:04 PM    Final          ______________________________________________________________________________________________     Recent Labs: 03/22/2024: ALT 10; BUN 9; Creatinine, Ser 0.46; Hemoglobin 10.7; Platelets 280; Potassium 3.7; Sodium 137  Recent Lipid Panel No results found for: CHOL, TRIG, HDL, CHOLHDL, VLDL, LDLCALC, LDLDIRECT  History of Present Illness    34 year old female with the above past medical history including bradycardia, dizziness, and prediabetes.   She was referred to cardiology in the setting of persistent dizziness, fatigue, bradycardia, and intermittent nausea.  She was last seen in office on 09/11/2019 2020 for [facility] new [facility].  She denies any other symptoms. Cardiac monitor was ordered but not completed.  Echocardiogram in 09/2024 showed EF 60 to 65%, normal LV function, no RWMA, normal RV systolic function, no significant valvular abnormalities.  She presents today for follow-up accompanied by in person Spanish interpreter.  Since her last visit she has been stable from a cardiac standpoint.  She reports 1 episode of nausea with associated dizziness since her last visit, which occurred after exercising and eating pork. In the past month she has not had any symptoms of dizziness or nausea.  She did not wear her heart monitor in the setting of overall improvement in her symptoms.  She denies any chest pain, palpitations, presyncope, syncope, dyspnea, edema, PND, orthopnea, weight gain.  Overall, she reports feeling well.  Home Medications    Current Outpatient Medications  Medication Sig Dispense Refill   Ergocalciferol (VITAMIN D2 PO) Take by mouth.     No current facility-administered medications for this visit.      Review of Systems    She denies chest pain, palpitations, dyspnea, pnd, orthopnea, n, v, dizziness, syncope, edema, weight gain, or early satiety. All other systems reviewed and are otherwise negative except as noted above.   Physical Exam    VS:  BP 113/64 (Cuff Size: Normal)   Pulse 60   Ht 4' 9 (1.448 m)   Wt 145 lb (65.8 kg)   SpO2 97%   BMI 31.38 kg/m   GEN: Well nourished, well developed, in no acute distress. HEENT: normal. Neck: Supple, no JVD, carotid bruits, or masses. Cardiac: RRR, no murmurs, rubs, or gallops. No clubbing, cyanosis, edema.  Radials/DP/PT 2+ and equal bilaterally.  Respiratory:  Respirations regular and unlabored, clear to auscultation bilaterally. GI: Soft, nontender, nondistended, BS + x 4. MS: no deformity or atrophy. Skin: warm and dry, no rash. Neuro:  Strength and sensation are intact. Psych: Normal affect.  Accessory Clinical Findings    ECG personally reviewed by me today -    - no EKG in office today.    Lab Results  Component Value Date   WBC 7.6 03/22/2024   HGB 10.7 (L) 03/22/2024   HCT 32.6 (L) 03/22/2024   MCV 84.7 03/22/2024   PLT 280 03/22/2024   Lab Results  Component Value Date   CREATININE 0.46 03/22/2024   BUN 9 03/22/2024   NA 137 03/22/2024   K 3.7 03/22/2024   CL 107 03/22/2024   CO2 20 (L) 03/22/2024   Lab Results  Component Value Date   ALT 10 03/22/2024   AST 22 03/22/2024   ALKPHOS 166 (H) 03/22/2024   BILITOT 0.4 03/22/2024   No results found for: CHOL, HDL,  LDLCALC, LDLDIRECT, TRIG, CHOLHDL  Lab Results  Component Value Date   HGBA1C 5.6 10/10/2023    Assessment & Plan    1. Bradycardia/dizziness/generalized fatigue: She was referred to cardiology in the setting of dizziness, fatigue, intermittent nausea.  Echo in 09/2024 showed EF 60 to 65%, normal LV function, no RWMA, normal RV systolic function, no significant valvular abnormalities.  She did not wear the ZIO monitor as her  symptoms have improved.  She had 1 episode of nausea with associated dizziness that occurred approximately 1 month ago.  She denies any further symptoms.  Denies palpitations, presyncope, syncope.  HR stable in office today. I advised her that should she have recurrent symptoms, she should wear the monitor as previously recommended, and schedule follow-up accordingly.  Otherwise, she can return the monitor and follow-up as needed.  Patient verbalized understanding.  2. Prediabetes: A1c was 5.8 in 06/2024.  Monitored and managed per PCP.  Encouraged ongoing lifestyle modifications with diet and exercise.   3. Disposition: Follow-up as needed.       Damien JAYSON Braver, NP 11/06/2024, 8:18 AM       [1] No Known Allergies  "

## 2024-11-06 NOTE — Patient Instructions (Signed)
 Medication Instructions:  Your physician recommends that you continue on your current medications as directed. Please refer to the Current Medication list given to you today.  *If you need a refill on your cardiac medications before your next appointment, please call your pharmacy*  Lab Work: NONE ordered at this time of appointment   Testing/Procedures: NONE ordered at this time of appointment   Follow-Up: At [facility], you and your health needs are our priority.  As part of our continuing mission to provide you with exceptional heart care, our providers are all part of one team.  This team includes your primary Cardiologist (physician) and Advanced Practice Providers or APPs (Physician Assistants and Nurse Practitioners) who all work together to provide you with the care you need, when you need it.  Your next appointment:    As needed  Provider:   Kardie Tobb, DO or Damien Braver, NP          We recommend signing up for the patient portal called MyChart.  Sign up information is provided on this After Visit Summary.  MyChart is used to connect with patients for Virtual Visits (Telemedicine).  Patients are able to view lab/test results, encounter notes, upcoming appointments, etc.  Non-urgent messages can be sent to your provider as well.   To learn more about what you can do with MyChart, go to [email or web address].   Other Instructions
